# Patient Record
Sex: Female | Born: 1950 | Race: White | Hispanic: No | Marital: Single | Smoking: Never smoker
Health system: Southern US, Community
[De-identification: ages and names within clinical notes are randomized; demographics above are authoritative.]

## PROBLEM LIST (undated history)

## (undated) DIAGNOSIS — R0602 Shortness of breath: Secondary | ICD-10-CM

## (undated) DIAGNOSIS — F419 Anxiety disorder, unspecified: Secondary | ICD-10-CM

## (undated) DIAGNOSIS — K219 Gastro-esophageal reflux disease without esophagitis: Secondary | ICD-10-CM

## (undated) DIAGNOSIS — G4730 Sleep apnea, unspecified: Secondary | ICD-10-CM

## (undated) DIAGNOSIS — L719 Rosacea, unspecified: Secondary | ICD-10-CM

## (undated) DIAGNOSIS — D649 Anemia, unspecified: Secondary | ICD-10-CM

## (undated) DIAGNOSIS — E669 Obesity, unspecified: Secondary | ICD-10-CM

## (undated) DIAGNOSIS — R002 Palpitations: Secondary | ICD-10-CM

## (undated) DIAGNOSIS — M549 Dorsalgia, unspecified: Secondary | ICD-10-CM

## (undated) DIAGNOSIS — K353 Acute appendicitis with localized peritonitis, without perforation or gangrene: Secondary | ICD-10-CM

## (undated) DIAGNOSIS — M2550 Pain in unspecified joint: Secondary | ICD-10-CM

## (undated) DIAGNOSIS — I10 Essential (primary) hypertension: Secondary | ICD-10-CM

## (undated) DIAGNOSIS — T7840XA Allergy, unspecified, initial encounter: Secondary | ICD-10-CM

## (undated) DIAGNOSIS — Z86010 Personal history of colonic polyps: Principal | ICD-10-CM

## (undated) DIAGNOSIS — M7989 Other specified soft tissue disorders: Secondary | ICD-10-CM

## (undated) DIAGNOSIS — F32A Depression, unspecified: Secondary | ICD-10-CM

## (undated) HISTORY — PX: COLONOSCOPY: SHX174

## (undated) HISTORY — DX: Acute appendicitis with localized peritonitis, without perforation or gangrene: K35.30

## (undated) HISTORY — DX: Pain in unspecified joint: M25.50

## (undated) HISTORY — PX: OTHER SURGICAL HISTORY: SHX169

## (undated) HISTORY — DX: Other specified soft tissue disorders: M79.89

## (undated) HISTORY — DX: Palpitations: R00.2

## (undated) HISTORY — DX: Rosacea, unspecified: L71.9

## (undated) HISTORY — DX: Depression, unspecified: F32.A

## (undated) HISTORY — DX: Sleep apnea, unspecified: G47.30

## (undated) HISTORY — DX: Essential (primary) hypertension: I10

## (undated) HISTORY — PX: WISDOM TOOTH EXTRACTION: SHX21

## (undated) HISTORY — DX: Gastro-esophageal reflux disease without esophagitis: K21.9

## (undated) HISTORY — DX: Dorsalgia, unspecified: M54.9

## (undated) HISTORY — DX: Anemia, unspecified: D64.9

## (undated) HISTORY — DX: Personal history of colonic polyps: Z86.010

## (undated) HISTORY — DX: Anxiety disorder, unspecified: F41.9

## (undated) HISTORY — DX: Shortness of breath: R06.02

## (undated) HISTORY — DX: Allergy, unspecified, initial encounter: T78.40XA

## (undated) HISTORY — PX: ABDOMINAL HYSTERECTOMY: SHX81

## (undated) HISTORY — PX: KNEE SURGERY: SHX244

## (undated) HISTORY — DX: Obesity, unspecified: E66.9

---

## 1997-08-28 ENCOUNTER — Other Ambulatory Visit: Admission: RE | Admit: 1997-08-28 | Discharge: 1997-08-28 | Payer: Self-pay | Admitting: *Deleted

## 1997-08-31 ENCOUNTER — Ambulatory Visit (HOSPITAL_COMMUNITY): Admission: RE | Admit: 1997-08-31 | Discharge: 1997-08-31 | Payer: Self-pay | Admitting: *Deleted

## 2003-01-26 ENCOUNTER — Other Ambulatory Visit: Admission: RE | Admit: 2003-01-26 | Discharge: 2003-01-26 | Payer: Self-pay | Admitting: *Deleted

## 2003-04-29 ENCOUNTER — Ambulatory Visit (HOSPITAL_COMMUNITY): Admission: RE | Admit: 2003-04-29 | Discharge: 2003-04-29 | Payer: Self-pay | Admitting: *Deleted

## 2003-05-06 ENCOUNTER — Encounter: Admission: RE | Admit: 2003-05-06 | Discharge: 2003-05-06 | Payer: Self-pay | Admitting: *Deleted

## 2013-11-06 ENCOUNTER — Other Ambulatory Visit: Payer: Self-pay

## 2014-05-18 ENCOUNTER — Other Ambulatory Visit: Payer: Self-pay

## 2014-05-18 DIAGNOSIS — Z1231 Encounter for screening mammogram for malignant neoplasm of breast: Secondary | ICD-10-CM

## 2014-05-18 DIAGNOSIS — I10 Essential (primary) hypertension: Secondary | ICD-10-CM | POA: Insufficient documentation

## 2014-05-18 DIAGNOSIS — Z0000 Encounter for general adult medical examination without abnormal findings: Secondary | ICD-10-CM | POA: Insufficient documentation

## 2014-05-18 DIAGNOSIS — G4733 Obstructive sleep apnea (adult) (pediatric): Secondary | ICD-10-CM | POA: Insufficient documentation

## 2014-05-18 DIAGNOSIS — F419 Anxiety disorder, unspecified: Secondary | ICD-10-CM | POA: Insufficient documentation

## 2014-06-01 HISTORY — PX: APPENDECTOMY: SHX54

## 2014-06-28 ENCOUNTER — Emergency Department (HOSPITAL_COMMUNITY): Payer: BC Managed Care – PPO | Admitting: Anesthesiology

## 2014-06-28 ENCOUNTER — Encounter (HOSPITAL_COMMUNITY)
Admission: EM | Disposition: A | Discharge status: Home / Self Care | Payer: Self-pay | Source: Home / Self Care | Attending: Emergency Medicine

## 2014-06-28 ENCOUNTER — Ambulatory Visit (INDEPENDENT_AMBULATORY_CARE_PROVIDER_SITE_OTHER): Payer: BC Managed Care – PPO | Admitting: Family Medicine

## 2014-06-28 ENCOUNTER — Encounter (HOSPITAL_COMMUNITY): Payer: Self-pay | Admitting: Emergency Medicine

## 2014-06-28 ENCOUNTER — Emergency Department (HOSPITAL_COMMUNITY): Payer: BC Managed Care – PPO

## 2014-06-28 ENCOUNTER — Observation Stay (HOSPITAL_COMMUNITY)
Admission: EM | Admit: 2014-06-28 | Discharge: 2014-06-29 | Disposition: A | Discharge status: Home / Self Care | Payer: BC Managed Care – PPO | Attending: General Surgery | Admitting: General Surgery

## 2014-06-28 VITALS — BP 138/62 | HR 97 | Temp 99.2°F | Resp 20 | Ht 68.0 in | Wt 211.0 lb

## 2014-06-28 DIAGNOSIS — R1031 Right lower quadrant pain: Secondary | ICD-10-CM | POA: Diagnosis not present

## 2014-06-28 DIAGNOSIS — E669 Obesity, unspecified: Secondary | ICD-10-CM | POA: Diagnosis not present

## 2014-06-28 DIAGNOSIS — K353 Acute appendicitis with localized peritonitis, without perforation or gangrene: Secondary | ICD-10-CM

## 2014-06-28 DIAGNOSIS — R319 Hematuria, unspecified: Secondary | ICD-10-CM | POA: Diagnosis not present

## 2014-06-28 DIAGNOSIS — Z6830 Body mass index (BMI) 30.0-30.9, adult: Secondary | ICD-10-CM | POA: Diagnosis not present

## 2014-06-28 DIAGNOSIS — G4730 Sleep apnea, unspecified: Secondary | ICD-10-CM | POA: Diagnosis not present

## 2014-06-28 DIAGNOSIS — I10 Essential (primary) hypertension: Secondary | ICD-10-CM | POA: Insufficient documentation

## 2014-06-28 DIAGNOSIS — D72829 Elevated white blood cell count, unspecified: Secondary | ICD-10-CM | POA: Diagnosis not present

## 2014-06-28 DIAGNOSIS — K429 Umbilical hernia without obstruction or gangrene: Secondary | ICD-10-CM | POA: Insufficient documentation

## 2014-06-28 HISTORY — PX: LAPAROSCOPIC APPENDECTOMY: SHX408

## 2014-06-28 HISTORY — PX: UMBILICAL HERNIA REPAIR: SHX196

## 2014-06-28 HISTORY — DX: Acute appendicitis with localized peritonitis, without perforation or gangrene: K35.30

## 2014-06-28 LAB — COMPLETE METABOLIC PANEL WITH GFR
AST: 17 U/L (ref 0–37)
Albumin: 4 g/dL (ref 3.5–5.2)
BUN: 10 mg/dL (ref 6–23)
CO2: 29 mEq/L (ref 19–32)
Chloride: 100 mEq/L (ref 96–112)
Creat: 0.66 mg/dL (ref 0.50–1.10)
GFR, Est Non African American: >89 mL/min
Glucose, Bld: 83 mg/dL (ref 70–99)
Potassium: 3.7 mEq/L (ref 3.5–5.3)
Total Bilirubin: 0.6 mg/dL (ref 0.2–1.2)
Total Protein: 6.8 g/dL (ref 6.0–8.3)

## 2014-06-28 LAB — POCT URINALYSIS DIPSTICK
Bilirubin, UA: NEGATIVE
Glucose, UA: NEGATIVE
Ketones, UA: NEGATIVE
Leukocytes, UA: NEGATIVE
Nitrite, UA: NEGATIVE
Protein, UA: NEGATIVE
Spec Grav, UA: 1.015
Urobilinogen, UA: 0.2
pH, UA: 7

## 2014-06-28 LAB — COMPREHENSIVE METABOLIC PANEL
ALK PHOS: 65 U/L (ref 39–117)
ALT: 37 U/L — ABNORMAL HIGH (ref 0–35)
ANION GAP: 10 (ref 5–15)
AST: 23 U/L (ref 0–37)
Albumin: 4.1 g/dL (ref 3.5–5.2)
BUN: 10 mg/dL (ref 6–23)
CALCIUM: 8.9 mg/dL (ref 8.4–10.5)
CO2: 28 mmol/L (ref 19–32)
Chloride: 101 mmol/L (ref 96–112)
Creatinine, Ser: 0.6 mg/dL (ref 0.50–1.10)
GFR calc Af Amer: >90 mL/min (ref >90)
GFR calc non Af Amer: >90 mL/min (ref >90)
Glucose, Bld: 101 mg/dL — ABNORMAL HIGH (ref 70–99)
POTASSIUM: 3 mmol/L — AB (ref 3.5–5.1)
SODIUM: 139 mmol/L (ref 135–145)
TOTAL PROTEIN: 7.1 g/dL (ref 6.0–8.3)
Total Bilirubin: 0.6 mg/dL (ref 0.3–1.2)

## 2014-06-28 LAB — CBC WITH DIFFERENTIAL/PLATELET
Basophils Absolute: 0 10*3/uL (ref 0.0–0.1)
Basophils Relative: 0 % (ref 0–1)
EOS PCT: 1 % (ref 0–5)
Eosinophils Absolute: 0.1 10*3/uL (ref 0.0–0.7)
HCT: 36.6 % (ref 36.0–46.0)
HEMOGLOBIN: 12.1 g/dL (ref 12.0–15.0)
Lymphocytes Relative: 10 % — ABNORMAL LOW (ref 12–46)
Lymphs Abs: 1.5 10*3/uL (ref 0.7–4.0)
MCH: 29.9 pg (ref 26.0–34.0)
MCHC: 33.1 g/dL (ref 30.0–36.0)
MCV: 90.4 fL (ref 78.0–100.0)
Monocytes Absolute: 0.6 10*3/uL (ref 0.1–1.0)
Monocytes Relative: 5 % (ref 3–12)
NEUTROS ABS: 11.9 10*3/uL — AB (ref 1.7–7.7)
Neutrophils Relative %: 84 % — ABNORMAL HIGH (ref 43–77)
PLATELETS: 266 10*3/uL (ref 150–400)
RBC: 4.05 MIL/uL (ref 3.87–5.11)
RDW: 13.1 % (ref 11.5–15.5)
WBC: 14.1 10*3/uL — ABNORMAL HIGH (ref 4.0–10.5)

## 2014-06-28 LAB — URINALYSIS, ROUTINE W REFLEX MICROSCOPIC
Bilirubin Urine: NEGATIVE
GLUCOSE, UA: NEGATIVE mg/dL
Hgb urine dipstick: NEGATIVE
Ketones, ur: NEGATIVE mg/dL
Leukocytes, UA: NEGATIVE
Nitrite: NEGATIVE
Protein, ur: NEGATIVE mg/dL
Specific Gravity, Urine: 1.003 — ABNORMAL LOW (ref 1.005–1.030)
Urobilinogen, UA: 0.2 mg/dL (ref 0.0–1.0)
pH: 6 (ref 5.0–8.0)

## 2014-06-28 LAB — POCT CBC
Granulocyte percent: 84 % — AB (ref 37–80)
HCT, POC: 36.3 % — AB (ref 37.7–47.9)
Hemoglobin: 12.1 g/dL — AB (ref 12.2–16.2)
Lymph, poc: 1.8 (ref 0.6–3.4)
MCH, POC: 30 pg (ref 27–31.2)
MCHC: 33.3 g/dL (ref 31.8–35.4)
MCV: 90.3 fL (ref 80–97)
MID (cbc): 0.8 (ref 0–0.9)
MPV: 6.7 fL (ref 0–99.8)
POC Granulocyte: 13.3 — AB (ref 2–6.9)
POC LYMPH PERCENT: 11.1 %L (ref 10–50)
POC MID %: 4.9 % (ref 0–12)
Platelet Count, POC: 308 10*3/uL (ref 142–424)
RBC: 4.03 M/uL — AB (ref 4.04–5.48)
RDW, POC: 13.6 %
WBC: 15.8 10*3/uL — AB (ref 4.6–10.2)

## 2014-06-28 LAB — COMPLETE METABOLIC PANEL WITHOUT GFR
ALT: 32 U/L (ref 0–35)
Alkaline Phosphatase: 63 U/L (ref 39–117)
Calcium: 9 mg/dL (ref 8.4–10.5)
GFR, Est African American: >89 mL/min
Sodium: 139 meq/L (ref 135–145)

## 2014-06-28 LAB — POCT UA - MICROSCOPIC ONLY
Bacteria, U Microscopic: NEGATIVE
Casts, Ur, LPF, POC: NEGATIVE
Crystals, Ur, HPF, POC: NEGATIVE
Mucus, UA: NEGATIVE
RBC, urine, microscopic: NEGATIVE
Yeast, UA: NEGATIVE

## 2014-06-28 LAB — LIPASE, BLOOD: Lipase: 15 U/L (ref 11–59)

## 2014-06-28 SURGERY — APPENDECTOMY, LAPAROSCOPIC
Anesthesia: General

## 2014-06-28 MED ORDER — HYDROCODONE-ACETAMINOPHEN 5-325 MG PO TABS: 1.0000 | ORAL_TABLET | Freq: Four times a day (QID) | ORAL | Status: DC | PRN

## 2014-06-28 MED ORDER — SODIUM CHLORIDE 0.9 % IV BOLUS (SEPSIS)
500.0000 mL | Freq: Once | INTRAVENOUS | Status: AC
  Administered 2014-06-28: 500 mL via INTRAVENOUS

## 2014-06-28 MED ORDER — HYDROMORPHONE HCL 1 MG/ML IJ SOLN: 0.2500 mg | INTRAMUSCULAR | Status: DC | PRN

## 2014-06-28 MED ORDER — ONDANSETRON HCL 4 MG/2ML IJ SOLN: 4.0000 mg | Freq: Four times a day (QID) | INTRAMUSCULAR | Status: DC | PRN

## 2014-06-28 MED ORDER — LOSARTAN POTASSIUM-HCTZ 100-25 MG PO TABS: 1.0000 | ORAL_TABLET | Freq: Every day | ORAL | Status: DC

## 2014-06-28 MED ORDER — ONDANSETRON HCL 4 MG PO TABS: 4.0000 mg | ORAL_TABLET | Freq: Four times a day (QID) | ORAL | Status: DC | PRN

## 2014-06-28 MED ORDER — BUPIVACAINE-EPINEPHRINE 0.5% -1:200000 IJ SOLN
INTRAMUSCULAR | Status: AC
Start: 2014-06-28 — End: 2014-06-28
  Filled 2014-06-28: qty 1

## 2014-06-28 MED ORDER — IOHEXOL 300 MG/ML  SOLN
100.0000 mL | Freq: Once | INTRAMUSCULAR | Status: AC | PRN
  Administered 2014-06-28: 100 mL via INTRAVENOUS

## 2014-06-28 MED ORDER — PIPERACILLIN-TAZOBACTAM 3.375 G IVPB 30 MIN
3.3750 g | Freq: Once | INTRAVENOUS | Status: AC
  Administered 2014-06-28: 3.375 g via INTRAVENOUS
  Filled 2014-06-28: qty 50

## 2014-06-28 MED ORDER — METOCLOPRAMIDE HCL 5 MG/ML IJ SOLN
INTRAMUSCULAR | Status: DC | PRN
  Administered 2014-06-28: 10 mg via INTRAVENOUS

## 2014-06-28 MED ORDER — PROMETHAZINE HCL 25 MG/ML IJ SOLN: 6.2500 mg | INTRAMUSCULAR | Status: DC | PRN

## 2014-06-28 MED ORDER — GLYCOPYRROLATE 0.2 MG/ML IJ SOLN
INTRAMUSCULAR | Status: DC | PRN
  Administered 2014-06-28: .8 mg via INTRAVENOUS

## 2014-06-28 MED ORDER — PROPOFOL 10 MG/ML IV BOLUS
INTRAVENOUS | Status: DC | PRN
  Administered 2014-06-28: 170 mg via INTRAVENOUS

## 2014-06-28 MED ORDER — KCL IN DEXTROSE-NACL 20-5-0.9 MEQ/L-%-% IV SOLN
INTRAVENOUS | Status: DC
  Administered 2014-06-29: 04:00:00 via INTRAVENOUS
  Filled 2014-06-28 (×3): qty 1000

## 2014-06-28 MED ORDER — LACTATED RINGERS IV SOLN
INTRAVENOUS | Status: DC | PRN
  Administered 2014-06-28: 21:00:00 via INTRAVENOUS

## 2014-06-28 MED ORDER — DEXAMETHASONE SODIUM PHOSPHATE 10 MG/ML IJ SOLN
INTRAMUSCULAR | Status: DC | PRN
  Administered 2014-06-28: 10 mg via INTRAVENOUS

## 2014-06-28 MED ORDER — 0.9 % SODIUM CHLORIDE (POUR BTL) OPTIME
TOPICAL | Status: DC | PRN
  Administered 2014-06-28: 1000 mL

## 2014-06-28 MED ORDER — METOCLOPRAMIDE HCL 5 MG/ML IJ SOLN
INTRAMUSCULAR | Status: AC
  Filled 2014-06-28: qty 2

## 2014-06-28 MED ORDER — LACTATED RINGERS IV SOLN
INTRAVENOUS | Status: DC
  Administered 2014-06-28: 23:00:00 via INTRAVENOUS

## 2014-06-28 MED ORDER — HEPARIN SODIUM (PORCINE) 5000 UNIT/ML IJ SOLN
5000.0000 [IU] | Freq: Three times a day (TID) | INTRAMUSCULAR | Status: DC
  Administered 2014-06-29: 5000 [IU] via SUBCUTANEOUS
  Filled 2014-06-28 (×4): qty 1

## 2014-06-28 MED ORDER — CISATRACURIUM BESYLATE 20 MG/10ML IV SOLN
INTRAVENOUS | Status: AC
  Filled 2014-06-28: qty 10

## 2014-06-28 MED ORDER — CISATRACURIUM BESYLATE (PF) 10 MG/5ML IV SOLN
INTRAVENOUS | Status: DC | PRN
  Administered 2014-06-28: 8 mg via INTRAVENOUS

## 2014-06-28 MED ORDER — TAMSULOSIN HCL 0.4 MG PO CAPS
0.4000 mg | ORAL_CAPSULE | Freq: Every day | ORAL | Status: DC
  Administered 2014-06-29: 0.4 mg via ORAL
  Filled 2014-06-28: qty 1

## 2014-06-28 MED ORDER — MIDAZOLAM HCL 2 MG/2ML IJ SOLN
INTRAMUSCULAR | Status: AC
  Filled 2014-06-28: qty 2

## 2014-06-28 MED ORDER — MIDAZOLAM HCL 5 MG/5ML IJ SOLN
INTRAMUSCULAR | Status: DC | PRN
  Administered 2014-06-28: 2 mg via INTRAVENOUS

## 2014-06-28 MED ORDER — OXYCODONE HCL 5 MG PO TABS: 5.0000 mg | ORAL_TABLET | Freq: Once | ORAL | Status: DC | PRN

## 2014-06-28 MED ORDER — BUPIVACAINE-EPINEPHRINE 0.5% -1:200000 IJ SOLN
INTRAMUSCULAR | Status: DC | PRN
  Administered 2014-06-28: 50 mL

## 2014-06-28 MED ORDER — SUCCINYLCHOLINE CHLORIDE 20 MG/ML IJ SOLN
INTRAMUSCULAR | Status: DC | PRN
  Administered 2014-06-28: 100 mg via INTRAVENOUS

## 2014-06-28 MED ORDER — TAMSULOSIN HCL 0.4 MG PO CAPS: 0.4000 mg | ORAL_CAPSULE | Freq: Every day | ORAL | Status: DC

## 2014-06-28 MED ORDER — ONDANSETRON HCL 4 MG/2ML IJ SOLN
INTRAMUSCULAR | Status: DC | PRN
Start: 2014-06-28 — End: 2014-06-28
  Administered 2014-06-28: 4 mg via INTRAVENOUS

## 2014-06-28 MED ORDER — PROPOFOL 10 MG/ML IV BOLUS
INTRAVENOUS | Status: AC
  Filled 2014-06-28: qty 20

## 2014-06-28 MED ORDER — MENTHOL 3 MG MT LOZG
1.0000 | LOZENGE | OROMUCOSAL | Status: DC | PRN
  Administered 2014-06-28: 3 mg via ORAL
  Filled 2014-06-28 (×2): qty 9

## 2014-06-28 MED ORDER — POTASSIUM CHLORIDE 10 MEQ/100ML IV SOLN
10.0000 meq | INTRAVENOUS | Status: AC
  Administered 2014-06-28: 10 meq via INTRAVENOUS
  Filled 2014-06-28 (×2): qty 100

## 2014-06-28 MED ORDER — NEOSTIGMINE METHYLSULFATE 10 MG/10ML IV SOLN
INTRAVENOUS | Status: AC
  Filled 2014-06-28: qty 1

## 2014-06-28 MED ORDER — ONDANSETRON HCL 4 MG/2ML IJ SOLN
INTRAMUSCULAR | Status: AC
  Filled 2014-06-28: qty 2

## 2014-06-28 MED ORDER — ONDANSETRON HCL 4 MG/2ML IJ SOLN
4.0000 mg | Freq: Once | INTRAMUSCULAR | Status: AC
  Administered 2014-06-28: 4 mg via INTRAVENOUS
  Filled 2014-06-28: qty 2

## 2014-06-28 MED ORDER — GLYCOPYRROLATE 0.2 MG/ML IJ SOLN
INTRAMUSCULAR | Status: AC
  Filled 2014-06-28: qty 3

## 2014-06-28 MED ORDER — VENLAFAXINE HCL ER 37.5 MG PO CP24
37.5000 mg | ORAL_CAPSULE | Freq: Every day | ORAL | Status: DC
  Administered 2014-06-29: 37.5 mg via ORAL
  Filled 2014-06-28 (×2): qty 1

## 2014-06-28 MED ORDER — FENTANYL CITRATE 0.05 MG/ML IJ SOLN
INTRAMUSCULAR | Status: DC | PRN
  Administered 2014-06-28: 100 ug via INTRAVENOUS

## 2014-06-28 MED ORDER — GLYCOPYRROLATE 0.2 MG/ML IJ SOLN
INTRAMUSCULAR | Status: AC
  Filled 2014-06-28: qty 1

## 2014-06-28 MED ORDER — MORPHINE SULFATE 4 MG/ML IJ SOLN
4.0000 mg | Freq: Once | INTRAMUSCULAR | Status: AC
  Administered 2014-06-28: 4 mg via INTRAVENOUS
  Filled 2014-06-28: qty 1

## 2014-06-28 MED ORDER — LOSARTAN POTASSIUM 50 MG PO TABS
100.0000 mg | ORAL_TABLET | Freq: Every day | ORAL | Status: DC
  Administered 2014-06-29: 100 mg via ORAL
  Filled 2014-06-28: qty 2

## 2014-06-28 MED ORDER — LACTATED RINGERS IR SOLN
Status: DC | PRN
  Administered 2014-06-28: 1000 mL

## 2014-06-28 MED ORDER — IOHEXOL 300 MG/ML  SOLN
25.0000 mL | Freq: Once | INTRAMUSCULAR | Status: AC | PRN
  Administered 2014-06-28: 25 mL via ORAL

## 2014-06-28 MED ORDER — HYDROCHLOROTHIAZIDE 25 MG PO TABS
25.0000 mg | ORAL_TABLET | Freq: Every day | ORAL | Status: DC
  Administered 2014-06-29: 25 mg via ORAL
  Filled 2014-06-28: qty 1

## 2014-06-28 MED ORDER — DEXAMETHASONE SODIUM PHOSPHATE 10 MG/ML IJ SOLN
INTRAMUSCULAR | Status: AC
  Filled 2014-06-28: qty 1

## 2014-06-28 MED ORDER — OXYCODONE HCL 5 MG/5ML PO SOLN
5.0000 mg | Freq: Once | ORAL | Status: DC | PRN
  Filled 2014-06-28: qty 5

## 2014-06-28 MED ORDER — NEOSTIGMINE METHYLSULFATE 10 MG/10ML IV SOLN
INTRAVENOUS | Status: DC | PRN
  Administered 2014-06-28: 5 mg via INTRAVENOUS

## 2014-06-28 MED ORDER — OXYCODONE-ACETAMINOPHEN 5-325 MG PO TABS
1.0000 | ORAL_TABLET | ORAL | Status: DC | PRN
  Administered 2014-06-29 (×2): 1 via ORAL
  Filled 2014-06-28 (×2): qty 1

## 2014-06-28 MED ORDER — MORPHINE SULFATE 2 MG/ML IJ SOLN
2.0000 mg | INTRAMUSCULAR | Status: DC | PRN
  Administered 2014-06-29: 2 mg via INTRAVENOUS
  Filled 2014-06-28: qty 1

## 2014-06-28 MED ORDER — FENTANYL CITRATE 0.05 MG/ML IJ SOLN
INTRAMUSCULAR | Status: AC
  Filled 2014-06-28: qty 5

## 2014-06-28 SURGICAL SUPPLY — 30 items
APPLIER CLIP 5 13 M/L LIGAMAX5 (MISCELLANEOUS)
APPLIER CLIP ROT 10 11.4 M/L (STAPLE)
CHLORAPREP W/TINT 26ML (MISCELLANEOUS) ×3 IMPLANT
CLIP APPLIE 5 13 M/L LIGAMAX5 (MISCELLANEOUS) IMPLANT
CLIP APPLIE ROT 10 11.4 M/L (STAPLE) IMPLANT
CUTTER FLEX LINEAR 45M (STAPLE) ×3 IMPLANT
DECANTER SPIKE VIAL GLASS SM (MISCELLANEOUS) ×3 IMPLANT
DERMABOND ADVANCED (GAUZE/BANDAGES/DRESSINGS) ×2
DERMABOND ADVANCED .7 DNX12 (GAUZE/BANDAGES/DRESSINGS) ×1 IMPLANT
DRAPE LAPAROSCOPIC ABDOMINAL (DRAPES) ×3 IMPLANT
ELECT REM PT RETURN 9FT ADLT (ELECTROSURGICAL) ×3
ELECTRODE REM PT RTRN 9FT ADLT (ELECTROSURGICAL) ×1 IMPLANT
GLOVE ECLIPSE 7.5 STRL STRAW (GLOVE) ×3 IMPLANT
GOWN STRL REUS W/TWL XL LVL3 (GOWN DISPOSABLE) ×6 IMPLANT
KIT BASIN OR (CUSTOM PROCEDURE TRAY) ×3 IMPLANT
LIQUID BAND (GAUZE/BANDAGES/DRESSINGS) IMPLANT
PENCIL BUTTON HOLSTER BLD 10FT (ELECTRODE) ×3 IMPLANT
POUCH SPECIMEN RETRIEVAL 10MM (ENDOMECHANICALS) ×3 IMPLANT
RELOAD 45 VASCULAR/THIN (ENDOMECHANICALS) IMPLANT
RELOAD STAPLE TA45 3.5 REG BLU (ENDOMECHANICALS) ×3 IMPLANT
SET IRRIG TUBING LAPAROSCOPIC (IRRIGATION / IRRIGATOR) ×3 IMPLANT
SHEARS HARMONIC ACE PLUS 36CM (ENDOMECHANICALS) ×3 IMPLANT
SUT MNCRL AB 4-0 PS2 18 (SUTURE) ×3 IMPLANT
SUT VICRYL 0 UR6 27IN ABS (SUTURE) ×3 IMPLANT
TOWEL OR 17X26 10 PK STRL BLUE (TOWEL DISPOSABLE) ×3 IMPLANT
TRAY FOLEY CATH 14FRSI W/METER (CATHETERS) ×3 IMPLANT
TRAY LAPAROSCOPIC (CUSTOM PROCEDURE TRAY) ×3 IMPLANT
TROCAR BLADELESS OPT 5 100 (ENDOMECHANICALS) ×3 IMPLANT
TROCAR XCEL 12X100 BLDLESS (ENDOMECHANICALS) ×3 IMPLANT
TROCAR XCEL BLUNT TIP 100MML (ENDOMECHANICALS) ×3 IMPLANT

## 2014-06-28 NOTE — Transfer of Care (Signed)
Immediate Anesthesia Transfer of Care Note  Patient: Robin Mills  Procedure(s) Performed: Procedure(s): APPENDECTOMY LAPAROSCOPIC (N/A) HERNIA REPAIR UMBILICAL ADULT (N/A)  Patient Location: PACU  Anesthesia Type:General  Level of Consciousness: awake, sedated and patient cooperative  Airway & Oxygen Therapy: Patient Spontanous Breathing and Patient connected to face mask oxygen  Post-op Assessment: Report given to RN and Post -op Vital signs reviewed and stable  Post vital signs: Reviewed and stable  Last Vitals:  Filed Vitals:   06/28/14 1920  BP: 102/43  Pulse: 79  Temp: 36.7 C  Resp: 18    Complications: No apparent anesthesia complications

## 2014-06-28 NOTE — Anesthesia Postprocedure Evaluation (Signed)
Anesthesia Post Note  Patient: Robin Mills  Procedure(s) Performed: Procedure(s) (LRB): APPENDECTOMY LAPAROSCOPIC (N/A) HERNIA REPAIR UMBILICAL ADULT (N/A)  Anesthesia type: general  Patient location: PACU  Post pain: Pain level controlled  Post assessment: Patient's Cardiovascular Status Stable  Last Vitals:  Filed Vitals:   06/28/14 2204  BP: 152/58  Pulse: 86  Temp: 37 C  Resp:     Post vital signs: Reviewed and stable  Level of consciousness: sedated  Complications: No apparent anesthesia complications

## 2014-06-28 NOTE — Op Note (Signed)
Preoperative Diagnosis: RLQ abdominal pain [R10.31] Acute appendicitis with localized peritonitis [K35.3]  Postoprative Diagnosis: RLQ abdominal pain [R10.31] Acute appendicitis with localized peritonitis [K35.3]  Procedure: Procedure(s): APPENDECTOMY LAPAROSCOPIC HERNIA REPAIR UMBILICAL ADULT   Surgeon: Excell Seltzer T   Assistants: None  Anesthesia:  General endotracheal anesthesia  Indications: Patient is a 64 year old female that presents with 18 hours of worsening persistent right lower quadrant abdominal pain. She has elevated white count and localized peritonitis on exam and CT scan has confirmed apparent uncomplicated significant acute appendicitis. I recommended proceeding with laparoscopic appendectomy. We've discussed the surgery and indications and risks detailed elsewhere and she agrees to proceed    Procedure Detail:  Patient received broad-spectrum preoperative IV antibiotics in the emergency department. She was brought to the operating room, placed in the supine position on the operating table, and general endotracheal anesthesia induced. Foley catheter was placed. PAS were placed. The abdomen was widely sterilely prepped and draped. Patient timeout was performed and correct procedure verified. A curvilinear incision was made just beneath the umbilicus. The patient had a known small umbilical hernia. The hernia sac and contents were dissected away from the umbilical skin and the hernia sac opened and the sac and preperitoneal fat excised with a Harmonic scalpel. This left approximately 1 cm fascial defect. 2 interrupted 0 Prolene sutures were placed on either end of the defect in the esophageal trocar inserted and pneumoperitoneum established. Under direct vision a 5 mm trocar was placed in the right upper quadrant and a 12 mm trocar in the left lower quadrant. The appendix was visualized just medial to the cecum and elevated. It was acutely inflamed with exudate but no  evidence of gangrene or perforation. A few inflammatory adhesions of the terminal ileum were easily taken down with blunt dissection and the base of the appendix and mesoappendix well exposed. The mesoappendix was then sequentially divided with the Harmonic scalpel until the appendix was completely freed out of the tip of the cecum. The appendix was then divided across the tip of the cecum with a single firing of the GIA 45 mm blue load stapler. The staple line was intact and without bleeding. The appendix was placed in Endo Catch bag and brought out through the umbilical incision. The operative site and general abdomen were irrigated and suctioned until clear. There was no evidence of bleeding or trocar injury or other problems. The [facility] trocar was removed and I placed an additional figure-of-eight suture of 0 Vicryl at the fascial defect. The umbilical sutures were tied and the closure was visualized from the left lower quadrant camera site internally and appeared fine. All CO2 was evacuated and trochars removed. Skin incisions were closed with subcutaneous, Monocryl and Dermabond. Sponge needle and instrument counts were correct.    Findings: Acute appendicitis with exudate, no gangrene or perforation  Estimated Blood Loss:  Minimal         Drains: nnone  Blood Given: none          Specimens: Appendix        Complications:  * No complications entered in OR log *         Disposition: PACU - hemodynamically stable.         Condition: stable

## 2014-06-28 NOTE — Progress Notes (Signed)
Error incorrect wound class. Correct wound class is contaminated.

## 2014-06-28 NOTE — Anesthesia Procedure Notes (Signed)
Procedure Name: Intubation Date/Time: 06/28/2014 8:49 PM Performed by: Johnathan Hausen A Pre-anesthesia Checklist: Patient identified, Timeout performed, Emergency Drugs available, Suction available and Patient being monitored Patient Re-evaluated:Patient Re-evaluated prior to inductionOxygen Delivery Method: Circle system utilized Preoxygenation: Pre-oxygenation with 100% oxygen Intubation Type: IV induction and Cricoid Pressure applied Ventilation: Mask ventilation without difficulty Laryngoscope Size: Mac and 4 Grade View: Grade I Tube type: Oral Tube size: 7.5 mm Number of attempts: 1 Airway Equipment and Method: Stylet Placement Confirmation: ETT inserted through vocal cords under direct vision,  positive ETCO2 and breath sounds checked- equal and bilateral Secured at: 23 cm Tube secured with: Tape Dental Injury: Teeth and Oropharynx as per pre-operative assessment

## 2014-06-28 NOTE — ED Notes (Signed)
Zofran offered however pt declines offer. Protocol labs not ordered due to collection at [facility].

## 2014-06-28 NOTE — H&P (Signed)
Robin Mills is an 64 y.o. female.    Chief Complaint: Abdominal pain  HPI: Patient presents with the gradual onset 18 hours ago of right lower quadrant abdominal pain. This worsened last night and through the day today. The pain became much worse with any movement and she would feel lightheaded and dizzy when she tried to get up and move around. She developed nausea without vomiting this morning. Also felt chilled and low-grade fever. She presented to urgent care and then was sent here to the emergency department for further evaluation. No vomiting. No change in bowel habits. No urinary symptoms. No previous history of similar symptoms or chronic GI complaints.  Past Medical History  Diagnosis Date  . Hypertension   . Sleep apnea     Past Surgical History  Procedure Laterality Date  . Abdominal hysterectomy    . Knee surgery    . Cardaic cath      2010-normal    Current Facility-Administered Medications  Medication Dose Route Frequency Provider Last Rate Last Dose  . potassium chloride 10 mEq in 100 mL IVPB  10 mEq Intravenous Q1 Hr x 3 Nat Christen, MD 100 mL/hr at 06/28/14 1826 10 mEq at 06/28/14 1826   Current Outpatient Prescriptions  Medication Sig Dispense Refill  . losartan-hydrochlorothiazide (HYZAAR) 100-25 MG per tablet Take 1 tablet by mouth daily.   7  . tetracycline (ACHROMYCIN,SUMYCIN) 500 MG capsule Take 500 mg by mouth daily.   3  . venlafaxine XR (EFFEXOR-XR) 37.5 MG 24 hr capsule Take 37.5 mg by mouth daily with breakfast.   3  . HYDROcodone-acetaminophen (NORCO) 5-325 MG per tablet Take 1 tablet by mouth every 6 (six) hours as needed for moderate pain. Be careful with constipation 30 tablet 0  . tamsulosin (FLOMAX) 0.4 MG CAPS capsule Take 1 capsule (0.4 mg total) by mouth daily. 30 capsule 0    No family history on file. Social History:  reports that she has never smoked. She does not have any smokeless tobacco history on file. She reports that she does not  drink alcohol. Her drug history is not on file.  Allergies:  Allergies  Allergen Reactions  . Sulfa Antibiotics Hives      Results for orders placed or performed during the hospital encounter of 06/28/14 (from the past 48 hour(s))  CBC with Differential     Status: Abnormal   Collection Time: 06/28/14  3:42 PM  Result Value Ref Range   WBC 14.1 (H) 4.0 - 10.5 K/uL   RBC 4.05 3.87 - 5.11 MIL/uL   Hemoglobin 12.1 12.0 - 15.0 g/dL   HCT 36.6 36.0 - 46.0 %   MCV 90.4 78.0 - 100.0 fL   MCH 29.9 26.0 - 34.0 pg   MCHC 33.1 30.0 - 36.0 g/dL   RDW 13.1 11.5 - 15.5 %   Platelets 266 150 - 400 K/uL   Neutrophils Relative % 84 (H) 43 - 77 %   Neutro Abs 11.9 (H) 1.7 - 7.7 K/uL   Lymphocytes Relative 10 (L) 12 - 46 %   Lymphs Abs 1.5 0.7 - 4.0 K/uL   Monocytes Relative 5 3 - 12 %   Monocytes Absolute 0.6 0.1 - 1.0 K/uL   Eosinophils Relative 1 0 - 5 %   Eosinophils Absolute 0.1 0.0 - 0.7 K/uL   Basophils Relative 0 0 - 1 %   Basophils Absolute 0.0 0.0 - 0.1 K/uL  Comprehensive metabolic panel     Status: Abnormal  Collection Time: 06/28/14  3:42 PM  Result Value Ref Range   Sodium 139 135 - 145 mmol/L   Potassium 3.0 (L) 3.5 - 5.1 mmol/L   Chloride 101 96 - 112 mmol/L   CO2 28 19 - 32 mmol/L   Glucose, Bld 101 (H) 70 - 99 mg/dL   BUN 10 6 - 23 mg/dL   Creatinine, Ser 0.60 0.50 - 1.10 mg/dL   Calcium 8.9 8.4 - 10.5 mg/dL   Total Protein 7.1 6.0 - 8.3 g/dL   Albumin 4.1 3.5 - 5.2 g/dL   AST 23 0 - 37 U/L   ALT 37 (H) 0 - 35 U/L   Alkaline Phosphatase 65 39 - 117 U/L   Total Bilirubin 0.6 0.3 - 1.2 mg/dL   GFR calc non Af Amer >90 >90 mL/min   GFR calc Af Amer >90 >90 mL/min    Comment: (NOTE) The eGFR has been calculated using the CKD EPI equation. This calculation has not been validated in all clinical situations. eGFR's persistently <90 mL/min signify possible Chronic Kidney Disease.    Anion gap 10 5 - 15    Comment: Performed at [facility]  Lipase, blood      Status: None   Collection Time: 06/28/14  3:42 PM  Result Value Ref Range   Lipase 15 11 - 59 U/L    Comment: Performed at [facility]  Urinalysis, Routine w reflex microscopic     Status: Abnormal   Collection Time: 06/28/14  4:01 PM  Result Value Ref Range   Color, Urine YELLOW YELLOW   APPearance CLEAR CLEAR   Specific Gravity, Urine 1.003 (L) 1.005 - 1.030   pH 6.0 5.0 - 8.0   Glucose, UA NEGATIVE NEGATIVE mg/dL   Hgb urine dipstick NEGATIVE NEGATIVE   Bilirubin Urine NEGATIVE NEGATIVE   Ketones, ur NEGATIVE NEGATIVE mg/dL   Protein, ur NEGATIVE NEGATIVE mg/dL   Urobilinogen, UA 0.2 0.0 - 1.0 mg/dL   Nitrite NEGATIVE NEGATIVE   Leukocytes, UA NEGATIVE NEGATIVE    Comment: MICROSCOPIC NOT DONE ON URINES WITH NEGATIVE PROTEIN, BLOOD, LEUKOCYTES, NITRITE, OR GLUCOSE <1000 mg/dL.   Ct Abdomen Pelvis W Contrast  06/28/2014   CLINICAL DATA:  Right lower quadrant abdominal pain.  Nausea.  EXAM: CT ABDOMEN AND PELVIS WITH CONTRAST  TECHNIQUE: Multidetector CT imaging of the abdomen and pelvis was performed using the standard protocol following bolus administration of intravenous contrast.  CONTRAST:  126m OMNIPAQUE IOHEXOL 300 MG/ML SOLN, 231mOMNIPAQUE IOHEXOL 300 MG/ML SOLN  COMPARISON:  None.  FINDINGS: Lower chest: Left lower lobe scratch that mild left lower lobe atelectasis. There is also dependent subsegmental atelectasis in both lower lobes and in the lingula. Mild cylindrical bronchiectasis is present in both lower lobes.  Hepatobiliary: 5 mm hypodense lesion in segment 4a of the liver, technically too small to characterize although statistically likely to be a small cyst or hemangioma. Similar 3 mm lesion in segment for PE.  Dependent density in the gallbladder may reflect sludge or gallstones.  Pancreas: Unremarkable  Spleen: Unremarkable  Adrenals/Urinary Tract: Unremarkable  Stomach/Bowel: There is acute appendicitis, appendiceal diameter 1.1 cm, with enhancing mucosa and  periappendiceal stranding. No findings of rupture or abscess.  Scattered colonic diverticula present. There is several duodenal diverticula which did not appear inflamed.  Vascular/Lymphatic: Unremarkable  Reproductive: Uterus absent.  Ovaries not well seen.  Other: No supplemental non-categorized findings.  Musculoskeletal: Small umbilical hernia contains adipose tissue. Lumbar spondylosis and degenerative disc  disease observed.  IMPRESSION: 1. Acute appendicitis with appendiceal wall thickening, abnormal enhancement, and surrounding inflammatory stranding. No extraluminal gas or abscess. 2. Mild bibasilar atelectasis. Cylindrical bronchiectasis in both lower lobes. 3. Mild sigmoid colon diverticulosis. 4. Small umbilical hernia contains adipose tissue. 5. Several tiny hypodense lesions in segment 4 of the liver, technically too small to characterize although statistically likely to be small cysts or hemangiomas.   Electronically Signed   By: Van Clines M.D.   On: 06/28/2014 18:26    Review of Systems  Constitutional: Positive for fever and chills.  Respiratory: Negative.   Cardiovascular: Negative.   Gastrointestinal: Positive for nausea and abdominal pain. Negative for vomiting, diarrhea, constipation and blood in stool.  Genitourinary: Negative.     Blood pressure 102/43, pulse 79, temperature 98 F (36.7 C), temperature source Oral, resp. rate 18, SpO2 92 %. Physical Exam  General: Alert, Moderately obese Caucasian female, in no distress Skin: Warm and dry without rash or infection. HEENT: No palpable masses or thyromegaly. Sclera nonicteric.  Lymph nodes: No cervical, supraclavicular, or inguinal nodes palpable. Lungs: Breath sounds clear and equal without increased work of breathing Cardiovascular: Regular rate and rhythm without murmur. No JVD or edema. Peripheral pulses intact. Abdomen: Nondistended. There is well localized tenderness in the right lower quadrant with guarding and  local peritoneal signs.. No masses palpable. No organomegaly. Small reducible nontender umbilical hernia. Extremities: No edema or joint swelling or deformity. No chronic venous stasis changes. Neurologic: Alert and fully oriented. No gross motor deficits.  Assessment/Plan Acute appendicitis. No apparent complicating factors. I recommended proceeding with laparoscopic appendectomy. I discussed the indications for the surgery and its nature and recovery as well as risks of anesthetic complications, bleeding, infection, visceral injury and possible need for open surgery. She understands and agrees to proceed.  Daphane Odekirk T 06/28/2014, 7:30 PM

## 2014-06-28 NOTE — ED Notes (Signed)
Caryl Pina [phone] Daughter would like to be called if anything changes.

## 2014-06-28 NOTE — ED Notes (Signed)
This nurse at bedside to have OR consent signed Patient stated that she has specific concerns that she wants to speak with Dr. Excell Seltzer about (No students, No orientation employees) Patient stated that she wants OR MD to specifically right down these concerns/request on the consent Santiago Glad, RN in [place] called and made aware that patient has not yet signed the consent due to the above noted concerns Per Santiago Glad, RN patient is to stay in ED until consent can be signed Dr. Excell Seltzer to come to ED ED Charge made aware of delay

## 2014-06-28 NOTE — Patient Instructions (Signed)

## 2014-06-28 NOTE — ED Notes (Addendum)
Pt from UC c/o RLQ pain since last pm. Hervey Ard in character. Nausea present denies vomiting.Reports fevers. UC recommends CT of abdomen. Denies urinary symptoms.

## 2014-06-28 NOTE — ED Provider Notes (Addendum)
CSN: [id number]     Arrival date & time 06/28/14  1441 History   First MD Initiated Contact with Patient 06/28/14 1536     Chief Complaint  Patient presents with  . Abdominal Pain  . Nausea     (Consider location/radiation/quality/duration/timing/severity/associated sxs/prior Treatment) HPI... right lower quadrant pain since early this morning with associated anorexia. Past medical history includes obesity, sleep apnea, hypertension. Status post vaginal hysterectomy years ago. She was seen at a local urgent care center today and transferred to the emergency department. No dysuria, fever, chills, vaginal bleeding, vaginal discharge. Normal urinary output. Normal bowel movements.  Past Medical History  Diagnosis Date  . Hypertension   . Sleep apnea    Past Surgical History  Procedure Laterality Date  . Abdominal hysterectomy    . Knee surgery    . Cardaic cath      2010-normal   No family history on file. History  Substance Use Topics  . Smoking status: Never Smoker   . Smokeless tobacco: Not on file  . Alcohol Use: No     Comment: social   OB History    No data available     Review of Systems  All other systems reviewed and are negative.     Allergies  Sulfa antibiotics  Home Medications   Prior to Admission medications   Medication Sig Start Date End Date Taking? Authorizing Provider  losartan-hydrochlorothiazide (HYZAAR) 100-25 MG per tablet Take 1 tablet by mouth daily.  05/18/14  Yes Historical Provider, MD  tetracycline (ACHROMYCIN,SUMYCIN) 500 MG capsule Take 500 mg by mouth daily.  05/18/14  Yes Historical Provider, MD  venlafaxine XR (EFFEXOR-XR) 37.5 MG 24 hr capsule Take 37.5 mg by mouth daily with breakfast.  05/18/14  Yes Historical Provider, MD  HYDROcodone-acetaminophen (NORCO) 5-325 MG per tablet Take 1 tablet by mouth every 6 (six) hours as needed for moderate pain. Be careful with constipation 06/28/14   Thao P Le, DO  tamsulosin (FLOMAX) 0.4 MG CAPS  capsule Take 1 capsule (0.4 mg total) by mouth daily. 06/28/14   Thao P Le, DO   BP 138/62 mmHg  Pulse 95  Temp(Src) 98.1 F (36.7 C)  Resp 18  SpO2 97% Physical Exam  Constitutional: She is oriented to person, place, and time. She appears well-developed and well-nourished.  HENT:  Head: Normocephalic and atraumatic.  Eyes: Conjunctivae and EOM are normal. Pupils are equal, round, and reactive to light.  Neck: Normal range of motion. Neck supple.  Cardiovascular: Normal rate and regular rhythm.   Pulmonary/Chest: Effort normal and breath sounds normal.  Abdominal: Soft.  Tender right lower quadrant  Musculoskeletal: Normal range of motion.  Neurological: She is alert and oriented to person, place, and time.  Skin: Skin is warm and dry.  Psychiatric: She has a normal mood and affect. Her behavior is normal.  Nursing note and vitals reviewed.   ED Course  Procedures (including critical care time) Labs Review Labs Reviewed  CBC WITH DIFFERENTIAL/PLATELET - Abnormal; Notable for the following:    WBC 14.1 (*)    Neutrophils Relative % 84 (*)    Neutro Abs 11.9 (*)    Lymphocytes Relative 10 (*)    All other components within normal limits  COMPREHENSIVE METABOLIC PANEL - Abnormal; Notable for the following:    Potassium 3.0 (*)    Glucose, Bld 101 (*)    ALT 37 (*)    All other components within normal limits  URINALYSIS, ROUTINE W REFLEX  MICROSCOPIC - Abnormal; Notable for the following:    Specific Gravity, Urine 1.003 (*)    All other components within normal limits  LIPASE, BLOOD    Imaging Review Ct Abdomen Pelvis W Contrast  06/28/2014   CLINICAL DATA:  Right lower quadrant abdominal pain.  Nausea.  EXAM: CT ABDOMEN AND PELVIS WITH CONTRAST  TECHNIQUE: Multidetector CT imaging of the abdomen and pelvis was performed using the standard protocol following bolus administration of intravenous contrast.  CONTRAST:  117mL OMNIPAQUE IOHEXOL 300 MG/ML SOLN, 36mL OMNIPAQUE  IOHEXOL 300 MG/ML SOLN  COMPARISON:  None.  FINDINGS: Lower chest: Left lower lobe scratch that mild left lower lobe atelectasis. There is also dependent subsegmental atelectasis in both lower lobes and in the lingula. Mild cylindrical bronchiectasis is present in both lower lobes.  Hepatobiliary: 5 mm hypodense lesion in segment 4a of the liver, technically too small to characterize although statistically likely to be a small cyst or hemangioma. Similar 3 mm lesion in segment for PE.  Dependent density in the gallbladder may reflect sludge or gallstones.  Pancreas: Unremarkable  Spleen: Unremarkable  Adrenals/Urinary Tract: Unremarkable  Stomach/Bowel: There is acute appendicitis, appendiceal diameter 1.1 cm, with enhancing mucosa and periappendiceal stranding. No findings of rupture or abscess.  Scattered colonic diverticula present. There is several duodenal diverticula which did not appear inflamed.  Vascular/Lymphatic: Unremarkable  Reproductive: Uterus absent.  Ovaries not well seen.  Other: No supplemental non-categorized findings.  Musculoskeletal: Small umbilical hernia contains adipose tissue. Lumbar spondylosis and degenerative disc disease observed.  IMPRESSION: 1. Acute appendicitis with appendiceal wall thickening, abnormal enhancement, and surrounding inflammatory stranding. No extraluminal gas or abscess. 2. Mild bibasilar atelectasis. Cylindrical bronchiectasis in both lower lobes. 3. Mild sigmoid colon diverticulosis. 4. Small umbilical hernia contains adipose tissue. 5. Several tiny hypodense lesions in segment 4 of the liver, technically too small to characterize although statistically likely to be small cysts or hemangiomas.   Electronically Signed   By: Van Clines M.D.   On: 06/28/2014 18:26     EKG Interpretation None      MDM   Final diagnoses:  RLQ abdominal pain  Acute appendicitis with localized peritonitis    History and physical consistent with acute appendicitis.  CT scan confirms same. Discussed with Dr. Excell Seltzer.  IV Zosyn, IV pain management. Admit.    Nat Christen, MD 06/28/14 1846  Nat Christen, MD 06/28/14 [phone]

## 2014-06-28 NOTE — Progress Notes (Signed)
Chief Complaint:  Chief Complaint  Patient presents with  . Flank Pain    x 1 day, sharp, right, getting worse    HPI: Robin Mills is a 64 y.o. female who is here for  Once day hisoty of RLQ abd pain, denies fevers but doe sfeel warm, has had chill.  Last nght she was feeling some discomfort but now worse. There has been no pain where she can't cross her legs.  She was belching and burping, BM last night was small and round, not unusual for her. No hx of constipationhowever she goes to bathroom when she needs it She has been able to pass gas x 1 and then she belcedShe has nto eaten today, has had only water.  She has a pap smear scheduled for march Dr Robin Mills, no abnormal pap.   Heavy bleeding, no fibroids, over due for pap,  No urinary sysmtpoms, deneis ovarian cyst, kidney stones.  When she gets a harp pain it is 9/10   Past Medical History  Diagnosis Date  . Hypertension   . Sleep apnea    Past Surgical History  Procedure Laterality Date  . Abdominal hysterectomy    . Knee surgery    . Cardaic cath      2010-normal   History   Social History  . Marital Status: Unknown    Spouse Name: N/A  . Number of Children: N/A  . Years of Education: N/A   Social History Main Topics  . Smoking status: Never Smoker   . Smokeless tobacco: Not on file  . Alcohol Use: Not on file  . Drug Use: Not on file  . Sexual Activity: Not on file   Other Topics Concern  . None   Social History Narrative  . None   No family history on file. Allergies  Allergen Reactions  . Sulfa Antibiotics    Prior to Admission medications   Medication Sig Start Date End Date Taking? Authorizing Provider  losartan (COZAAR) 25 MG tablet Take 25 mg by mouth daily.   Yes Historical Provider, MD  tetracycline (ACHROMYCIN,SUMYCIN) 250 MG capsule Take 250 mg by mouth 4 (four) times daily.   Yes Historical Provider, MD  venlafaxine (EFFEXOR) 25 MG tablet Take 35 mg by mouth 2 (two)  times daily.   Yes Historical Provider, MD     ROS: The patient denies fevers, night sweats, unintentional weight loss, chest pain, palpitations, wheezing, dyspnea on exertion, nausea, vomiting, dysuria, hematuria, melena, numbness, weakness, or tingling.   All other systems have been reviewed and were otherwise negative with the exception of those mentioned in the HPI and as above.    PHYSICAL EXAM: Filed Vitals:   06/28/14 1247  BP: 138/62  Pulse: 97  Temp: 99.2 F (37.3 C)  Resp: 20   Filed Vitals:   06/28/14 1247  Height: 5\' 8"  (1.727 m)  Weight: 211 lb (95.709 kg)   Body mass index is 32.09 kg/(m^2).  General: Alert, no acute distress HEENT:  Normocephalic, atraumatic, oropharynx patent. EOMI, PERRLA Cardiovascular:  Regular rate and rhythm, no rubs murmurs or gallops.  No Carotid bruits, radial pulse intact. No pedal edema.  Respiratory: Clear to auscultation bilaterally.  No wheezes, rales, or rhonchi.  No cyanosis, no use of accessory musculature GI: No organomegaly, abdomen is soft and  + tenderness mid-RLQ , positive bowel sounds.  No masses. She has +/- rebound tenderness, she has guarding She is jumping up in pain when  palpated Skin: No rashes. Neurologic: Facial musculature symmetric. Psychiatric: Patient is appropriate throughout our interaction. Lymphatic: No cervical lymphadenopathy Musculoskeletal: Gait intact.   LABS: Results for orders placed or performed in visit on 06/28/14  POCT CBC  Result Value Ref Range   WBC 15.8 (A) 4.6 - 10.2 K/uL   Lymph, poc 1.8 0.6 - 3.4   POC LYMPH PERCENT 11.1 10 - 50 %L   MID (cbc) 0.8 0 - 0.9   POC MID % 4.9 0 - 12 %M   POC Granulocyte 13.3 (A) 2 - 6.9   Granulocyte percent 84.0 (A) 37 - 80 %G   RBC 4.03 (A) 4.04 - 5.48 M/uL   Hemoglobin 12.1 (A) 12.2 - 16.2 g/dL   HCT, POC 36.3 (A) 37.7 - 47.9 %   MCV 90.3 80 - 97 fL   MCH, POC 30.0 27 - 31.2 pg   MCHC 33.3 31.8 - 35.4 g/dL   RDW, POC 13.6 %   Platelet Count,  POC 308 142 - 424 K/uL   MPV 6.7 0 - 99.8 fL  POCT urinalysis dipstick  Result Value Ref Range   Color, UA lt. yellow    Clarity, UA clear    Glucose, UA neg    Bilirubin, UA neg    Ketones, UA neg    Spec Grav, UA 1.015    Blood, UA trace-lysed    pH, UA 7.0    Protein, UA neg    Urobilinogen, UA 0.2    Nitrite, UA neg    Leukocytes, UA Negative   POCT UA - Microscopic Only  Result Value Ref Range   WBC, Ur, HPF, POC 0-1    RBC, urine, microscopic neg    Bacteria, U Microscopic neg    Mucus, UA neg    Epithelial cells, urine per micros 0-1    Crystals, Ur, HPF, POC neg    Casts, Ur, LPF, POC neg    Yeast, UA neg    Renal tubular cells 0-1      EKG/XRAY:   Primary read interpreted by Dr. Marin Comment at [facility].   ASSESSMENT/PLAN: Encounter Diagnoses  Name Primary?  . RLQ abdominal pain Yes  . Leukocytosis   . Hematuria    Rule out appendicitis vs renal stone Will send to ER for further evaluation, notified [facility] triage Rx for flomax and norco cancelled , she needs to be evaluated in ER Fu prn   Gross sideeffects, risk and benefits, and alternatives of medications d/w patient. Patient is aware that all medications have potential sideeffects and we are unable to predict every sideeffect or drug-drug interaction that may occur.  Alechia Lezama, [place], DO 06/28/2014 2:49 PM

## 2014-06-28 NOTE — Anesthesia Preprocedure Evaluation (Signed)
Anesthesia Evaluation    History of Anesthesia Complications Negative for: history of anesthetic complications  Airway        Dental   Pulmonary sleep apnea ,          Cardiovascular hypertension,     Neuro/Psych negative neurological ROS  negative psych ROS   GI/Hepatic Neg liver ROS,   Endo/Other    Renal/GU negative Renal ROS     Musculoskeletal   Abdominal   Peds  Hematology negative hematology ROS (+)   Anesthesia Other Findings   Reproductive/Obstetrics                             Anesthesia Physical Anesthesia Plan  ASA: II and emergent  Anesthesia Plan: General   Post-op Pain Management:    Induction: Intravenous, Rapid sequence and Cricoid pressure planned  Airway Management Planned: Oral ETT  Additional Equipment:   Intra-op Plan:   Post-operative Plan: Extubation in OR  Informed Consent:   Plan Discussed with:   Anesthesia Plan Comments:         Anesthesia Quick Evaluation

## 2014-06-29 ENCOUNTER — Encounter (HOSPITAL_COMMUNITY): Payer: Self-pay | Admitting: General Surgery

## 2014-06-29 LAB — CBC
HCT: 35.5 % — ABNORMAL LOW (ref 36.0–46.0)
Hemoglobin: 11.6 g/dL — ABNORMAL LOW (ref 12.0–15.0)
MCH: 30 pg (ref 26.0–34.0)
MCHC: 32.7 g/dL (ref 30.0–36.0)
MCV: 91.7 fL (ref 78.0–100.0)
Platelets: 250 10*3/uL (ref 150–400)
RBC: 3.87 MIL/uL (ref 3.87–5.11)
RDW: 13.4 % (ref 11.5–15.5)
WBC: 11.7 10*3/uL — ABNORMAL HIGH (ref 4.0–10.5)

## 2014-06-29 LAB — BASIC METABOLIC PANEL
Anion gap: 6 (ref 5–15)
BUN: 10 mg/dL (ref 6–23)
CO2: 29 mmol/L (ref 19–32)
Calcium: 8.5 mg/dL (ref 8.4–10.5)
Chloride: 103 mmol/L (ref 96–112)
Creatinine, Ser: 0.76 mg/dL (ref 0.50–1.10)
GFR calc Af Amer: >90 mL/min (ref >90)
GFR calc non Af Amer: 89 mL/min — ABNORMAL LOW (ref >90)
GLUCOSE: 175 mg/dL — AB (ref 70–99)
POTASSIUM: 3.7 mmol/L (ref 3.5–5.1)
SODIUM: 138 mmol/L (ref 135–145)

## 2014-06-29 MED ORDER — HYDROCODONE-ACETAMINOPHEN 5-325 MG PO TABS: 1.0000 | ORAL_TABLET | Freq: Four times a day (QID) | ORAL | Status: DC | PRN

## 2014-06-29 MED ORDER — OXYCODONE-ACETAMINOPHEN 5-325 MG PO TABS: 1.0000 | ORAL_TABLET | Freq: Four times a day (QID) | ORAL | Status: DC | PRN

## 2014-06-29 MED ORDER — HYDROCODONE-ACETAMINOPHEN 5-325 MG PO TABS
1.0000 | ORAL_TABLET | ORAL | Status: DC | PRN
Start: 2014-06-29 — End: 2014-06-29

## 2014-06-29 NOTE — Discharge Instructions (Signed)

## 2014-06-29 NOTE — Discharge Summary (Signed)
[facility] Discharge Summary   Patient ID: Robin Mills MRN: [id number] DOB/AGE: 1951-01-24 64 y.o.  Admit date: 06/28/2014 Discharge date: 06/29/2014  Admitting Diagnosis: Acute appendicitis  Discharge Diagnosis Patient Active Problem List   Diagnosis Date Noted  . Acute appendicitis with localized peritonitis 06/28/2014    Consultants None  Imaging: Ct Abdomen Pelvis W Contrast  06/28/2014   CLINICAL DATA:  Right lower quadrant abdominal pain.  Nausea.  EXAM: CT ABDOMEN AND PELVIS WITH CONTRAST  TECHNIQUE: Multidetector CT imaging of the abdomen and pelvis was performed using the standard protocol following bolus administration of intravenous contrast.  CONTRAST:  168mL OMNIPAQUE IOHEXOL 300 MG/ML SOLN, 73mL OMNIPAQUE IOHEXOL 300 MG/ML SOLN  COMPARISON:  None.  FINDINGS: Lower chest: Left lower lobe scratch that mild left lower lobe atelectasis. There is also dependent subsegmental atelectasis in both lower lobes and in the lingula. Mild cylindrical bronchiectasis is present in both lower lobes.  Hepatobiliary: 5 mm hypodense lesion in segment 4a of the liver, technically too small to characterize although statistically likely to be a small cyst or hemangioma. Similar 3 mm lesion in segment for PE.  Dependent density in the gallbladder may reflect sludge or gallstones.  Pancreas: Unremarkable  Spleen: Unremarkable  Adrenals/Urinary Tract: Unremarkable  Stomach/Bowel: There is acute appendicitis, appendiceal diameter 1.1 cm, with enhancing mucosa and periappendiceal stranding. No findings of rupture or abscess.  Scattered colonic diverticula present. There is several duodenal diverticula which did not appear inflamed.  Vascular/Lymphatic: Unremarkable  Reproductive: Uterus absent.  Ovaries not well seen.  Other: No supplemental non-categorized findings.  Musculoskeletal: Small umbilical hernia contains adipose tissue. Lumbar spondylosis and degenerative disc disease  observed.  IMPRESSION: 1. Acute appendicitis with appendiceal wall thickening, abnormal enhancement, and surrounding inflammatory stranding. No extraluminal gas or abscess. 2. Mild bibasilar atelectasis. Cylindrical bronchiectasis in both lower lobes. 3. Mild sigmoid colon diverticulosis. 4. Small umbilical hernia contains adipose tissue. 5. Several tiny hypodense lesions in segment 4 of the liver, technically too small to characterize although statistically likely to be small cysts or hemangiomas.   Electronically Signed   By: Van Clines M.D.   On: 06/28/2014 18:26    Procedures Dr. Excell Seltzer (06/28/14) - Laparoscopic Appendectomy  Hospital Course:  64 y/o female who presented to [facility] with gradual onset 18 hours ago of right lower quadrant abdominal pain. This worsened last night and through the day today. The pain became much worse with any movement and she would feel lightheaded and dizzy when she tried to get up and move around. She developed nausea without vomiting this morning. Also felt chilled and low-grade fever. She presented to urgent care and then was sent here to the emergency department for further evaluation. No vomiting. No change in bowel habits. No urinary symptoms. No previous history of similar symptoms or chronic GI complaints.  Workup showed acute appendicitis.  Patient was admitted and underwent procedure listed above.  Tolerated procedure well and was transferred to the floor.  Diet was advanced as tolerated.  On POD #1, the patient was voiding well, tolerating diet, ambulating well, pain well controlled, vital signs stable, incisions c/d/i and felt stable for discharge home.  Patient will follow up in our office in 3 weeks and knows to call with questions or concerns.   Physical Exam: General:  Alert, NAD, pleasant, comfortable Abd:  Soft, ND, mild tenderness, incisions C/D/I     Medication List    TAKE these medications  HYDROcodone-acetaminophen 5-325 MG  per tablet  Commonly known as:  NORCO/VICODIN  Take 1-2 tablets by mouth every 6 (six) hours as needed for moderate pain or severe pain.     losartan-hydrochlorothiazide 100-25 MG per tablet  Commonly known as:  HYZAAR  Take 1 tablet by mouth daily.     tamsulosin 0.4 MG Caps capsule  Commonly known as:  FLOMAX  Take 1 capsule (0.4 mg total) by mouth daily.     tetracycline 500 MG capsule  Commonly known as:  ACHROMYCIN,SUMYCIN  Take 500 mg by mouth daily.     venlafaxine XR 37.5 MG 24 hr capsule  Commonly known as:  EFFEXOR-XR  Take 37.5 mg by mouth daily with breakfast.         Follow-up Information    Follow up with [facility] [place] On 07/21/2014.   Why:  For post-operation check. Your appointment is at 3:45p, please arrive at least 30 min before your appointment to complete your check in paperwork.  If you are unable to arrive 30 min prior to your appointment time we may have to cancel or reschedule you.   Contact information:   [address] [id number] [phone]      Signed: Excell Seltzer [facility] [phone]  06/29/2014, 10:27 AM  Agree with above. Feels better. Her daughter is in the room.  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 She is ready to go home.  Alphonsa Overall, MD, [facility] Pager: [phone] Office phone:  [phone]

## 2014-06-29 NOTE — Progress Notes (Signed)
UR completed 

## 2014-06-29 NOTE — Progress Notes (Signed)
Discharge instructions and prescription given to patient.  Questions answered.  Daughter at bedside to drive mother home.

## 2014-06-29 NOTE — Progress Notes (Signed)
CPAP set up and patient placed on auto titrate via nasal mask with 2 lpm O2 bleed in.  Patient stable and tolerating well.

## 2014-07-12 ENCOUNTER — Encounter: Payer: Self-pay | Admitting: Family Medicine

## 2014-07-27 ENCOUNTER — Ambulatory Visit
Admission: RE | Admit: 2014-07-27 | Discharge: 2014-07-27 | Disposition: A | Discharge status: Home / Self Care | Payer: BC Managed Care – PPO | Source: Ambulatory Visit

## 2014-07-27 DIAGNOSIS — Z1231 Encounter for screening mammogram for malignant neoplasm of breast: Secondary | ICD-10-CM

## 2015-05-24 DIAGNOSIS — E669 Obesity, unspecified: Secondary | ICD-10-CM | POA: Insufficient documentation

## 2015-05-24 DIAGNOSIS — K219 Gastro-esophageal reflux disease without esophagitis: Secondary | ICD-10-CM | POA: Insufficient documentation

## 2015-08-23 ENCOUNTER — Encounter: Payer: Self-pay | Admitting: Neurology

## 2015-08-23 ENCOUNTER — Telehealth: Payer: Self-pay | Admitting: Neurology

## 2015-08-23 ENCOUNTER — Institutional Professional Consult (permissible substitution): Payer: BC Managed Care – PPO | Admitting: Neurology

## 2015-08-23 NOTE — Telephone Encounter (Signed)
Given work phone number is for [employer] and nobody by her name is known to the Network engineer. CD

## 2015-10-04 ENCOUNTER — Institutional Professional Consult (permissible substitution): Payer: BC Managed Care – PPO | Admitting: Neurology

## 2015-10-11 ENCOUNTER — Telehealth: Payer: Self-pay

## 2015-10-11 ENCOUNTER — Institutional Professional Consult (permissible substitution): Payer: BC Managed Care – PPO | Admitting: Neurology

## 2015-10-11 NOTE — Telephone Encounter (Signed)
Patient called at 2:30 (time of appt) states that she was in a meeting that ran over and is in [employer]. Patient was advised by phone room to reschedule.

## 2015-10-12 ENCOUNTER — Encounter: Payer: Self-pay | Admitting: Neurology

## 2015-10-29 ENCOUNTER — Encounter: Payer: Self-pay | Admitting: Internal Medicine

## 2015-10-29 ENCOUNTER — Encounter: Payer: Self-pay | Admitting: Gastroenterology

## 2015-11-03 ENCOUNTER — Ambulatory Visit (INDEPENDENT_AMBULATORY_CARE_PROVIDER_SITE_OTHER): Payer: BC Managed Care – PPO | Admitting: Neurology

## 2015-11-03 ENCOUNTER — Encounter: Payer: Self-pay | Admitting: Neurology

## 2015-11-03 VITALS — BP 104/60 | HR 72 | Resp 16 | Ht 68.0 in | Wt 211.0 lb

## 2015-11-03 DIAGNOSIS — R351 Nocturia: Secondary | ICD-10-CM | POA: Diagnosis not present

## 2015-11-03 DIAGNOSIS — G4733 Obstructive sleep apnea (adult) (pediatric): Secondary | ICD-10-CM

## 2015-11-03 DIAGNOSIS — G4710 Hypersomnia, unspecified: Secondary | ICD-10-CM | POA: Diagnosis not present

## 2015-11-03 DIAGNOSIS — E669 Obesity, unspecified: Secondary | ICD-10-CM

## 2015-11-03 DIAGNOSIS — Z9989 Dependence on other enabling machines and devices: Principal | ICD-10-CM

## 2015-11-03 NOTE — Progress Notes (Signed)
Subjective:    Patient ID: Robin Mills is a 65 y.o. female.  HPI     Star Age, MD, PhD [facility] [address] [place], [place] [address]  Dear Dr. Ardeth Perfect,   I saw your patient, Robin Mills, upon your kind request in my neurologic clinic today for initial consultation of her sleep disorder, in particular, evaluation of her prior diagnosis of OSA and CPAP therapy. The patient is unaccompanied today. Of note, patient no showed for an appointment on 10/11/2015. As you know, Robin Mills is a 65 year old right-handed woman with an underlying medical history of hypertension, anxiety, rosacea, reflux disease, status post knee surgery, status post appendectomy in 2016, and obesity, who was diagnosed with obstructive sleep apnea and placed on CPAP therapy many years ago, last evaluation about 5-6 years ago. Her sleep study was conducted out of state, in [place] and results are not available for my review today. I reviewed CPAP compliance data from 10/04/2015 through 11/02/2015 which is a total of 30 days during which time she used her machine 29 days with percent used days greater than 4 hours at 96.7%, indicating excellent compliance with an average usage of 7 hours and 55 minutes. AHI information is not available, leak information is also not available on this download, her CPAP machine is an older model, Development worker, community Plus.  I reviewed your office note from 05/24/2015. [place]. She is divorced with 2 children (Daughter in [place] and Son is in [place]), she works as a Agricultural engineer.  She lives with her elderly mother and moved to be close to family and after her father died.  She has gained about 40 lb in the last 5 years.  Her original sleep apnea diagnosis goes back several years. She is currently on her second CPAP machine. She does have an older mask as well. She has been using this last mask  for about 6 months now. She needs an updated CPAP machine, updated diagnosis and updated supplies. In particular, in light of her weight gain which has been significant she is in need for reevaluation. She is working with her sister on her weight loss and has been more active physically in the last 10 days and more mindful about her caloric intake. She drinks caffeine in the form of coffee him about 2 cups per day, wine on weekends. She denies restless leg symptoms or leg twitching at night. She denies morning headaches. She has nocturia, about twice per night on average.  Her Past Medical History Is Significant For: Past Medical History  Diagnosis Date  . Hypertension   . Sleep apnea   . Anxiety   . Rosacea   . GERD (gastroesophageal reflux disease)   . Obesity     Her Past Surgical History Is Significant For: Past Surgical History  Procedure Laterality Date  . Abdominal hysterectomy    . Knee surgery    . Cardaic cath      2010-normal  . Laparoscopic appendectomy N/A 06/28/2014    Procedure: APPENDECTOMY LAPAROSCOPIC;  Surgeon: Excell Seltzer, MD;  Location: [facility];  Service: General;  Laterality: N/A;  . Umbilical hernia repair N/A 06/28/2014    Procedure: HERNIA REPAIR UMBILICAL ADULT;  Surgeon: Excell Seltzer, MD;  Location: [facility];  Service: General;  Laterality: N/A;    Her Family History Is Significant For: Family History  Problem Relation Age of Onset  . Heart attack Mother   .  Heart attack Paternal Grandfather   . Alzheimer's disease Father   . Sleep apnea Father   . Sleep apnea Brother   . Stroke Maternal Grandmother     His Social History Is Significant For: Social History   Social History  . Marital Status: Single    Spouse Name: N/A  . Number of Children: 2  . Years of Education: college   Occupational History  . [place] History Main Topics  . Smoking status: Never Smoker   . Smokeless tobacco: None  . Alcohol Use: 0.0  oz/week    0 Standard drinks or equivalent per week     Comment: social  . Drug Use: No  . Sexual Activity: Not Asked   Other Topics Concern  . None   Social History Narrative   Drinks 2 cups of coffee a day     Her Allergies Are:  Allergies  Allergen Reactions  . Sulfa Antibiotics Hives  :   His Current Medications Are:  Outpatient Encounter Prescriptions as of 11/03/2015  Medication Sig  . aspirin 325 MG tablet Take 325 mg by mouth as needed.  Marland Kitchen losartan-hydrochlorothiazide (HYZAAR) 100-25 MG per tablet Take 1 tablet by mouth daily.   Marland Kitchen omeprazole (PRILOSEC) 40 MG capsule Take 40 mg by mouth daily.  Marland Kitchen tetracycline (ACHROMYCIN,SUMYCIN) 500 MG capsule Take 500 mg by mouth daily.   Marland Kitchen venlafaxine XR (EFFEXOR-XR) 37.5 MG 24 hr capsule Take 37.5 mg by mouth daily with breakfast.   . [DISCONTINUED] HYDROcodone-acetaminophen (NORCO/VICODIN) 5-325 MG per tablet Take 1-2 tablets by mouth every 6 (six) hours as needed for moderate pain or severe pain.  . [DISCONTINUED] tamsulosin (FLOMAX) 0.4 MG CAPS capsule Take 1 capsule (0.4 mg total) by mouth daily.   No facility-administered encounter medications on file as of 11/03/2015.  :  Review of Systems:  Out of a complete 14 point review of systems, all are reviewed and negative with the exception of these symptoms as listed below:   Review of Systems  Neurological:       Patient states that she has some trouble falling asleep, nocturia, witnessed apnea, daytime tiredness.  Patient currently uses CPAP. Last sleep study was 5-6 years ago. Study done in [place], unable to get records.   Epworth Sleepiness Scale 0= would never doze 1= slight chance of dozing 2= moderate chance of dozing 3= high chance of dozing  Sitting and reading:0 Watching TV:1 Sitting inactive in a public place (ex. Theater or meeting):0 As a passenger in a car for an hour without a break:1 Lying down to rest in the afternoon:3 Sitting and talking to someone:0 Sitting  quietly after lunch (no alcohol):1 In a car, while stopped in traffic:0 Total:6   Objective:  Neurologic Exam  Physical Exam Physical Examination:   Filed Vitals:   11/03/15 1010  BP: 104/60  Pulse: 72  Resp: 16   General Examination: The patient is a very pleasant 65 y.o. female in no acute distress. She appears well-developed and well-nourished and very well groomed.  HEENT: Normocephalic, atraumatic, pupils are equal, round and reactive to light and accommodation. Funduscopic exam is normal with sharp disc margins noted. Extraocular tracking is good without limitation to gaze excursion or nystagmus noted. Normal smooth pursuit is noted. Hearing is grossly intact. Tympanic membranes are clear bilaterally. Face is symmetric with normal facial animation and normal facial sensation. Speech is clear with no dysarthria noted. There is no hypophonia. There is no lip,  neck/head, jaw or voice tremor. Neck is supple with full range of passive and active motion. There are no carotid bruits on auscultation. Oropharynx exam reveals: mild mouth dryness, good dental hygiene and moderate airway crowding, due to thicker soft palate and redundant soft palate. Mallampati is class II. Tongue protrudes centrally and palate elevates symmetrically. Tonsils are small in size. Neck size is 16 5/8 inches. She has a Mild overbite.    Chest: Clear to auscultation without wheezing, rhonchi or crackles noted.  Heart: S1+S2+0, regular and normal without murmurs, rubs or gallops noted.   Abdomen: Soft, non-tender and non-distended with normal bowel sounds appreciated on auscultation.  Extremities: There is no pitting edema in the distal lower extremities bilaterally. Pedal pulses are intact.  Skin: Warm and dry without trophic changes noted. There are no varicose veins.  Musculoskeletal: exam reveals no obvious joint deformities, tenderness or joint swelling or erythema.   Neurologically:  Mental status: The  patient is awake, alert and oriented in all 4 spheres. Her immediate and remote memory, attention, language skills and fund of knowledge are appropriate. There is no evidence of aphasia, agnosia, apraxia or anomia. Speech is clear with normal prosody and enunciation. Thought process is linear. Mood is normal and affect is normal.  Cranial nerves II - XII are as described above under HEENT exam. In addition: shoulder shrug is normal with equal shoulder height noted. Motor exam: Normal bulk, strength and tone is noted. There is no drift, tremor or rebound. Romberg is negative. Reflexes are 2+ throughout. Fine motor skills and coordination: intact with normal finger taps, normal hand movements, normal rapid alternating patting, normal foot taps and normal foot agility.  Cerebellar testing: No dysmetria or intention tremor on finger to nose testing. Heel to shin is unremarkable bilaterally. There is no truncal or gait ataxia.  Sensory exam: intact to light touch, pinprick, vibration, temperature sense in the upper and lower extremities.  Gait, station and balance: She stands easily. No veering to one side is noted. No leaning to one side is noted. Posture is age-appropriate and stance is narrow based. Gait shows normal stride length and normal pace. No problems turning are noted. Tandem walk is unremarkable.    Assessment and Plan:   In summary, Robin Mills is a very pleasant 65 y.o.-year old female with an underlying medical history of hypertension, anxiety, rosacea, reflux disease, status post knee surgery, status post appendectomy in 2016, and obesity, whose history and physical exam are in keeping with obstructive sleep apnea (OSA). She has a long-standing history of OSA, last evaluation was 5 or 6 years ago in [place], but she has had interim significant weight gain of about 40 pounds. I had a long chat with the patient about my findings and the diagnosis of OSA, its prognosis and treatment options.  We talked about medical treatments, surgical interventions and non-pharmacological approaches. I explained in particular the risks and ramifications of untreated moderate to severe OSA, especially with respect to developing cardiovascular disease down the Road, including congestive heart failure, difficult to treat hypertension, cardiac arrhythmias, or stroke. Even type 2 diabetes has, in part, been linked to untreated OSA. Symptoms of untreated OSA include daytime sleepiness, memory problems, mood irritability and mood disorder such as depression and anxiety, lack of energy, as well as recurrent headaches, especially morning headaches. We talked about trying to maintain a healthy lifestyle in general, as well as the importance of weight control. I encouraged the patient to eat healthy, exercise daily  and keep well hydrated, to keep a scheduled bedtime and wake time routine, to not skip any meals and eat healthy snacks in between meals. I advised the patient not to drive when feeling sleepy. I recommended the following at this time: sleep study with potential positive airway pressure titration. (We will score hypopneas at 3% and split the sleep study into diagnostic and treatment portion, if the estimated. 2 hour AHI is >15/h).   I explained the sleep test procedure to the patient and also outlined possible surgical and non-surgical treatment options of OSA, including the use of a custom-made dental device (which would require a referral to a specialist dentist or oral surgeon), upper airway surgical options, such as pillar implants, radiofrequency surgery, tongue base surgery, and UPPP (which would involve a referral to an ENT surgeon). Rarely, jaw surgery such as mandibular advancement may be considered.  I also explained the CPAP treatment option to the patient, who indicated that she would be willing to continue with CPAP. I explained the importance of being compliant with PAP treatment, not only for  insurance purposes but primarily to improve Her symptoms, and for the patient's long term health benefit, including to reduce Her cardiovascular risks. I answered all her questions today and the patient was in agreement. I would like to see her back after the sleep study is completed and encouraged her to call with any interim questions, concerns, problems or updates.   Thank you very much for allowing me to participate in the care of this nice patient. If I can be of any further assistance to you please do not hesitate to call me at [phone].  Sincerely,   Star Age, MD, PhD

## 2015-11-03 NOTE — Patient Instructions (Signed)
Based on your history and exam, you have obstructive sleep apnea (OSA). We should proceed with a sleep study to determine how severe it is and to get you updated supplies and optimize your treatment setting. Please remember, the risks and ramifications of moderate to severe obstructive sleep apnea or OSA are: Cardiovascular disease, including congestive heart failure, stroke, difficult to control hypertension, arrhythmias, and even type 2 diabetes has been linked to untreated OSA. Sleep apnea causes disruption of sleep and sleep deprivation in most cases, which, in turn, can cause recurrent headaches, problems with memory, mood, concentration, focus, and vigilance. Most people with untreated sleep apnea report excessive daytime sleepiness, which can affect their ability to drive. Please do not drive if you feel sleepy.   I will likely see you back after your sleep study to go over the test results and where to go from there. We will call you after your sleep study to advise about the results (most likely, you will hear from Beverlee Nims, my nurse) and to set up an appointment at the time, as necessary.    Our sleep lab administrative assistant, Arrie Aran will meet with you or call you to schedule your sleep study. If you don't hear back from her by next week please feel free to call her at [phone]. This is her direct line and please leave a message with your phone number to call back if you get the voicemail box. She will call back as soon as possible.

## 2015-11-23 ENCOUNTER — Ambulatory Visit (INDEPENDENT_AMBULATORY_CARE_PROVIDER_SITE_OTHER): Payer: BC Managed Care – PPO | Admitting: Neurology

## 2015-11-23 DIAGNOSIS — G4733 Obstructive sleep apnea (adult) (pediatric): Secondary | ICD-10-CM

## 2015-11-23 DIAGNOSIS — G4761 Periodic limb movement disorder: Secondary | ICD-10-CM

## 2015-11-29 ENCOUNTER — Telehealth: Payer: Self-pay | Admitting: Neurology

## 2015-11-29 DIAGNOSIS — G4733 Obstructive sleep apnea (adult) (pediatric): Secondary | ICD-10-CM

## 2015-11-29 NOTE — Telephone Encounter (Signed)
Robin Mills:  Patient referred by Dr. Ardeth Perfect, seen by me on 11/03/15, split study on 11/23/15, Ins: [employer]. Please call and notify patient that the recent sleep study confirmed the diagnosis of moderate OSA. She did very well with CPAP during the study with significant improvement of the respiratory events. Therefore, I will prescribe a new CPAP machine for her. May have existing DME?  The patient will need a follow up appointment with me in 8 to 10 weeks post set up that has to be scheduled; please go ahead and schedule while you have the patient on the phone and make sure patient understands the importance of keeping this window for the FU appointment, as it is often an insurance requirement and failing to adhere to this may result in losing coverage for sleep apnea treatment.  Please re-enforce the importance of compliance with treatment and the need for [place] to monitor compliance data - again an insurance requirement and good feedback for the patient as far as how they are doing.  Also remind patient, that any upcoming CPAP machine or mask issues, should be first addressed with the DME company. Please ask if patient has a preference regarding DME company.  Please arrange for CPAP set up at home through a DME company of patient's choice - once you have spoken to the patient - and faxed/routed report to PCP and referring MD (if other than PCP), you can close this encounter, thanks,   Star Age, MD, PhD [facility] ([place])

## 2015-11-29 NOTE — Telephone Encounter (Signed)
LM for patient to call back for results

## 2015-11-30 NOTE — Telephone Encounter (Signed)
Patient called back and was given results and recommendations below. She voiced understanding. I will send orders to [facility]. I will send report to PCP. I will also send the patient a letter reminding her to make f/u appt and stress the importance of compliance.

## 2015-12-11 ENCOUNTER — Ambulatory Visit (INDEPENDENT_AMBULATORY_CARE_PROVIDER_SITE_OTHER): Payer: BC Managed Care – PPO | Admitting: Physician Assistant

## 2015-12-11 ENCOUNTER — Encounter: Payer: Self-pay | Admitting: Physician Assistant

## 2015-12-11 VITALS — BP 124/80 | HR 78 | Temp 98.5°F | Resp 17 | Ht 67.5 in | Wt 211.0 lb

## 2015-12-11 DIAGNOSIS — R300 Dysuria: Secondary | ICD-10-CM

## 2015-12-11 DIAGNOSIS — N3001 Acute cystitis with hematuria: Secondary | ICD-10-CM | POA: Diagnosis not present

## 2015-12-11 LAB — POCT URINALYSIS DIP (MANUAL ENTRY)
Bilirubin, UA: NEGATIVE
Glucose, UA: NEGATIVE
Ketones, POC UA: NEGATIVE
Nitrite, UA: NEGATIVE
Protein Ur, POC: 100 — AB
Spec Grav, UA: 1.025
Urobilinogen, UA: 0.2
pH, UA: 5

## 2015-12-11 LAB — POC MICROSCOPIC URINALYSIS (UMFC): Mucus: ABSENT

## 2015-12-11 MED ORDER — NITROFURANTOIN MONOHYD MACRO 100 MG PO CAPS: 100.0000 mg | ORAL_CAPSULE | Freq: Two times a day (BID) | ORAL | 0 refills | Status: AC

## 2015-12-11 NOTE — Patient Instructions (Addendum)
  Finish all your abx Push all fluids   IF you received an x-ray today, you will receive an invoice from [facility]. Please contact [facility] at [phone] with questions or concerns regarding your invoice.   IF you received labwork today, you will receive an invoice from [employer]. Please contact [facility] at [phone] with questions or concerns regarding your invoice.   Our billing staff will not be able to assist you with questions regarding bills from these companies.  You will be contacted with the lab results as soon as they are available. The fastest way to get your results is to activate your My Chart account. Instructions are located on the last page of this paperwork. If you have not heard from [place] regarding the results in 2 weeks, please contact this office.

## 2015-12-11 NOTE — Progress Notes (Signed)
Robin Mills  MRN: [id number] DOB: 1950-06-29  Subjective:  Pt presents to clinic with increased bladder pressure, urgency/frequency, decreased urine volume, and burning with urination. Symptoms started this morning. She did noticed some blood this am. She took a leftover dose of cipro that she had in her cabinet this AM. She's had bladder infections in the past, but none in the last few years. Not currently sexually active, but a pap done on Wednesday.   Review of Systems  Constitutional: Negative for chills and fever.  Gastrointestinal: Positive for abdominal pain (pressure). Negative for nausea.  Genitourinary: Positive for dysuria, frequency and urgency. Negative for flank pain, hematuria and vaginal discharge.  Musculoskeletal: Negative for back pain.  All other systems reviewed and are negative.   Patient Active Problem List   Diagnosis Date Noted  . Acute appendicitis with localized peritonitis 06/28/2014    Current Outpatient Prescriptions on File Prior to Visit  Medication Sig Dispense Refill  . aspirin 325 MG tablet Take 325 mg by mouth as needed.    Marland Kitchen losartan-hydrochlorothiazide (HYZAAR) 100-25 MG per tablet Take 1 tablet by mouth daily.   7  . omeprazole (PRILOSEC) 40 MG capsule Take 40 mg by mouth daily.    Marland Kitchen tetracycline (ACHROMYCIN,SUMYCIN) 500 MG capsule Take 500 mg by mouth daily.   3  . venlafaxine XR (EFFEXOR-XR) 37.5 MG 24 hr capsule Take 37.5 mg by mouth daily with breakfast.   3   No current facility-administered medications on file prior to visit.     Allergies  Allergen Reactions  . Sulfa Antibiotics Hives    Pt patients past, family and social history were reviewed and updated.  Objective:  BP 124/80 (BP Location: Right Arm, Patient Position: Sitting, Cuff Size: Normal)   Pulse 78   Temp 98.5 F (36.9 C) (Oral)   Resp 17   Ht 5' 7.5" (1.715 m)   Wt 211 lb (95.7 kg)   SpO2 98%   BMI 32.56 kg/m   Physical Exam  Constitutional: She is  oriented to person, place, and time and well-developed, well-nourished, and in no distress. She appears not dehydrated.  Non-toxic appearance.  HENT:  Head: Normocephalic and atraumatic.  Right Ear: External ear normal.  Left Ear: External ear normal.  Eyes: Conjunctivae and lids are normal. No scleral icterus.  Neck: Neck supple.  Cardiovascular: Normal rate, regular rhythm and normal heart sounds.   No murmur heard. Pulmonary/Chest: Effort normal and breath sounds normal.  Abdominal: Soft. There is no tenderness. There is no CVA tenderness.  Musculoskeletal: Normal range of motion.  Lymphadenopathy:    She has no cervical adenopathy.  Neurological: She is alert and oriented to person, place, and time. Gait normal.  Skin: Skin is warm, dry and intact. She is not diaphoretic. No pallor.  Psychiatric: Mood, memory, affect and judgment normal.  Vitals reviewed.  Results for orders placed or performed in visit on 12/11/15  POCT Microscopic Urinalysis ([facility])  Result Value Ref Range   WBC,UR,HPF,POC Too numerous to count  (A) None WBC/hpf   RBC,UR,HPF,POC Too numerous to count  (A) None RBC/hpf   Bacteria Moderate (A) None, Too numerous to count   Mucus Absent Absent   Epithelial Cells, UR Per Microscopy Few (A) None, Too numerous to count cells/hpf  POCT urinalysis dipstick  Result Value Ref Range   Color, UA yellow yellow   Clarity, UA cloudy (A) clear   Glucose, UA negative negative   Bilirubin, UA negative negative  Ketones, POC UA negative negative   Spec Grav, UA 1.025    Blood, UA large (A) negative   pH, UA 5.0    Protein Ur, POC =100 (A) negative   Urobilinogen, UA 0.2    Nitrite, UA Negative Negative   Leukocytes, UA large (3+) (A) Negative   Assessment and Plan :  Dysuria - Plan: POCT Microscopic Urinalysis ([facility]), POCT urinalysis dipstick, Urine culture  Acute cystitis with hematuria - Plan: nitrofurantoin, macrocrystal-monohydrate, (MACROBID) 100 MG capsule    Push fluids, finish abx.  OK to use OTC AZO   Windell Hummingbird PA-C  [facility] 12/11/2015 2:28 PM

## 2015-12-13 LAB — URINE CULTURE: Organism ID, Bacteria: NO GROWTH

## 2015-12-17 ENCOUNTER — Telehealth: Payer: Self-pay

## 2015-12-17 NOTE — Telephone Encounter (Signed)
Pt was recently seen for bladder infection and the medication has helpted but still feeling pressure-she was told to call back and we would call in cipro for her if needed   Best number [phone]

## 2015-12-20 ENCOUNTER — Encounter: Payer: Self-pay | Admitting: *Deleted

## 2015-12-20 NOTE — Telephone Encounter (Signed)
Left message for pt to call back  °

## 2015-12-20 NOTE — Telephone Encounter (Signed)
As is noted in  My results note - her urine did not grow out bacteria so if she still is having symptoms she needs to be seen again

## 2015-12-21 NOTE — Telephone Encounter (Signed)
Spoke with pt and she stated does not have time to come in so she will not worry about it

## 2016-01-05 ENCOUNTER — Ambulatory Visit: Payer: BC Managed Care – PPO | Admitting: Gastroenterology

## 2016-01-06 ENCOUNTER — Ambulatory Visit (INDEPENDENT_AMBULATORY_CARE_PROVIDER_SITE_OTHER): Payer: Worker's Compensation

## 2016-01-06 ENCOUNTER — Encounter (HOSPITAL_COMMUNITY): Payer: Self-pay | Admitting: Emergency Medicine

## 2016-01-06 ENCOUNTER — Ambulatory Visit (HOSPITAL_COMMUNITY)
Admission: EM | Admit: 2016-01-06 | Discharge: 2016-01-06 | Disposition: A | Discharge status: Home / Self Care | Payer: Worker's Compensation | Attending: Internal Medicine | Admitting: Internal Medicine

## 2016-01-06 DIAGNOSIS — S8991XA Unspecified injury of right lower leg, initial encounter: Secondary | ICD-10-CM

## 2016-01-06 DIAGNOSIS — M25561 Pain in right knee: Secondary | ICD-10-CM

## 2016-01-06 MED ORDER — DICLOFENAC SODIUM 75 MG PO TBEC: 75.0000 mg | DELAYED_RELEASE_TABLET | Freq: Two times a day (BID) | ORAL | 0 refills | Status: DC

## 2016-01-06 MED ORDER — HYDROCODONE-ACETAMINOPHEN 5-325 MG PO TABS: 2.0000 | ORAL_TABLET | ORAL | 0 refills | Status: DC | PRN

## 2016-01-06 MED ORDER — HYDROCODONE-ACETAMINOPHEN 7.5-325 MG PO TABS: 1.0000 | ORAL_TABLET | Freq: Four times a day (QID) | ORAL | 0 refills | Status: DC | PRN

## 2016-01-06 NOTE — ED Triage Notes (Signed)
Pt here for right knee inj onset today while at work... Reports she fell onto cement flooring  Sx include: swelling, pain  Brought back on wheelchair  A&O x4... NAD

## 2016-01-06 NOTE — ED Provider Notes (Signed)
CSN: [id number]     Arrival date & time 01/06/16  1905 History   First MD Initiated Contact with Patient 01/06/16 2046     Chief Complaint  Patient presents with  . 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 Other symptoms include: Limping, painful to walk Pain score: 4 FAMILY HISTORY: Hypertension    Past Medical History:  Diagnosis Date  . Anxiety   . GERD (gastroesophageal reflux disease)   . Hypertension   . Obesity   . Rosacea   . Sleep apnea    Past Surgical History:  Procedure Laterality Date  . ABDOMINAL HYSTERECTOMY    . cardaic cath     2010-normal  . KNEE SURGERY    . LAPAROSCOPIC APPENDECTOMY N/A 06/28/2014   Procedure: APPENDECTOMY LAPAROSCOPIC;  Surgeon: Excell Seltzer, MD;  Location: [facility];  Service: General;  Laterality: N/A;  . UMBILICAL HERNIA REPAIR N/A 06/28/2014   Procedure: HERNIA REPAIR UMBILICAL ADULT;  Surgeon: Excell Seltzer, MD;  Location: [facility];  Service: General;  Laterality: N/A;   Family History  Problem Relation Age of Onset  . Heart attack Mother   . Alzheimer's disease Father   . Sleep apnea Father   . Heart attack Paternal Grandfather   . Sleep apnea Brother   . Cancer Brother   . Stroke Maternal Grandmother    Social History  Substance Use Topics  . Smoking status: Never Smoker  . Smokeless tobacco: Never Used  . Alcohol use 0.0 oz/week     Comment: 3-4 per week   OB History    No data available     Review of Systems  Denies: HEADACHE, NAUSEA, ABDOMINAL PAIN, CHEST PAIN, CONGESTION, DYSURIA, SHORTNESS OF BREATH  Allergies  Sulfa antibiotics  Home Medications   Prior to Admission  medications   Medication Sig Start Date End Date Taking? Authorizing Provider  losartan-hydrochlorothiazide (HYZAAR) 100-25 MG per tablet Take 1 tablet by mouth daily.  05/18/14  Yes Historical Provider, MD  omeprazole (PRILOSEC) 40 MG capsule Take 40 mg by mouth daily.   Yes Historical Provider, MD  tetracycline (ACHROMYCIN,SUMYCIN) 500 MG capsule Take 500 mg by mouth daily.  05/18/14  Yes Historical Provider, MD  aspirin 325 MG tablet Take 325 mg by mouth as needed.    Historical Provider, MD  venlafaxine XR (EFFEXOR-XR) 37.5 MG 24 hr capsule Take 37.5 mg by mouth daily with breakfast.  05/18/14   Historical Provider, MD   Meds Ordered and Administered this Visit  Medications - No data to display  BP 143/59 (BP Location: Left Arm)   Pulse 75   Temp 98 F (36.7 C) (Oral)   Resp 16   SpO2 96%  No data found.   Physical Exam NURSES NOTES AND VITAL SIGNS REVIEWED. CONSTITUTIONAL: Well developed, well nourished, no acute distress HEENT: normocephalic, atraumatic EYES: Conjunctiva normal NECK:normal ROM, supple, no adenopathy PULMONARY:No respiratory distress, normal effort ABDOMINAL: Soft, ND, NT BS+, No CVAT MUSCULOSKELETAL: Normal ROM of all extremities, right knee: swollen, tender, effusion. Sensory and motor intact distally. Joint stable to valgus and varus stress.  SKIN: warm and dry without rash PSYCHIATRIC: Mood and affect, behavior are normal  Urgent Care Course   Clinical Course  Procedures (including critical care time)  Labs Review Labs Reviewed - No data to display  Imaging Review Dg Knee Complete 4 Views Right  Result Date: 01/06/2016 CLINICAL DATA:  Fall EXAM: RIGHT KNEE - COMPLETE 4+ VIEW COMPARISON:  None. FINDINGS: Four views of the right knee submitted. There is narrowing of medial joint compartment periods moderate spurring of medial tibial plateau and medial femoral condyle. Mild spurring of lateral tibial plateau and lateral femoral condyle. Narrowing of  patellofemoral joint space. Small joint effusion. No acute fracture or subluxation. IMPRESSION: No acute fracture or subluxation. Osteoarthritic changes as described above. Electronically Signed   By: Lahoma Crocker M.D.   On: 01/06/2016 21:21   Discussed with patient prior to discharge  Visual Acuity Review  Right Eye Distance:   Left Eye Distance:   Bilateral Distance:    Right Eye Near:   Left Eye Near:    Bilateral Near:         MDM   1. Knee injury, right, initial encounter   2. Knee pain, acute, right     Patient is reassured that there are no issues that require transfer to higher level of care at this time or additional tests. Patient is advised to continue home symptomatic treatment. Patient is advised that if there are new or worsening symptoms to attend the emergency department, contact primary care provider, or return to UC. Instructions of care provided discharged home in stable condition.    THIS NOTE WAS GENERATED USING A VOICE RECOGNITION SOFTWARE PROGRAM. ALL REASONABLE EFFORTS  WERE MADE TO PROOFREAD THIS DOCUMENT FOR ACCURACY.  I have verbally reviewed the discharge instructions with the patient. A printed AVS was given to the patient.  All questions were answered prior to discharge.      Konrad Felix, PA 01/06/16 2133

## 2016-01-08 ENCOUNTER — Telehealth (HOSPITAL_COMMUNITY): Payer: Self-pay | Admitting: Emergency Medicine

## 2016-01-08 NOTE — Telephone Encounter (Signed)
Verifying what medications written for patient.  Aquilla Solian, CMA called patient to verify how patient doing  And how medications are working for her.  Meza, CMA left a message for patient to call [facility]

## 2016-08-03 ENCOUNTER — Encounter: Payer: Self-pay | Admitting: Internal Medicine

## 2016-11-15 ENCOUNTER — Encounter: Payer: Self-pay | Admitting: Internal Medicine

## 2016-12-27 ENCOUNTER — Telehealth: Payer: Self-pay | Admitting: *Deleted

## 2016-12-27 NOTE — Telephone Encounter (Signed)
Patient no show pv today. Called pt, no answer, left message for patient to call us back to reschedule nurse visit before 5 pm today.

## 2017-01-04 ENCOUNTER — Encounter: Payer: Self-pay | Admitting: *Deleted

## 2017-01-04 ENCOUNTER — Ambulatory Visit (AMBULATORY_SURGERY_CENTER): Payer: Self-pay | Admitting: *Deleted

## 2017-01-04 VITALS — Ht 68.0 in | Wt 207.0 lb

## 2017-01-04 DIAGNOSIS — Z1211 Encounter for screening for malignant neoplasm of colon: Secondary | ICD-10-CM

## 2017-01-04 NOTE — Progress Notes (Signed)
No egg or soy allergy known to patient  No issues with past sedation with any surgeries  or procedures, no intubation problems - with wisdom teeth had sodium pentathol and had negative reaction but no other issues    diet pills per patient- takes Qsymia- will hold for 10 days  No home 02 use per patient  No blood thinners per patient  Pt denies issues with constipation  No A fib or A flutter  EMMI video sent to pt's e mail - pt declined

## 2017-01-10 ENCOUNTER — Encounter: Payer: Self-pay | Admitting: Internal Medicine

## 2017-01-16 ENCOUNTER — Encounter: Payer: Self-pay | Admitting: Internal Medicine

## 2017-01-16 ENCOUNTER — Ambulatory Visit (AMBULATORY_SURGERY_CENTER): Payer: BC Managed Care – PPO | Admitting: Internal Medicine

## 2017-01-16 VITALS — BP 124/69 | HR 78 | Temp 99.5°F | Resp 11 | Ht 68.0 in | Wt 207.0 lb

## 2017-01-16 DIAGNOSIS — Z1211 Encounter for screening for malignant neoplasm of colon: Secondary | ICD-10-CM

## 2017-01-16 DIAGNOSIS — D125 Benign neoplasm of sigmoid colon: Secondary | ICD-10-CM

## 2017-01-16 DIAGNOSIS — Z1212 Encounter for screening for malignant neoplasm of rectum: Secondary | ICD-10-CM | POA: Diagnosis not present

## 2017-01-16 MED ORDER — SODIUM CHLORIDE 0.9 % IV SOLN: 500.0000 mL | INTRAVENOUS | Status: DC

## 2017-01-16 NOTE — Patient Instructions (Addendum)
I found and removed one small polyp that looks benign.  I will let you know pathology results and when to have another routine colonoscopy by mail and/or My Chart.  You also have a condition called diverticulosis - common and not usually a problem. Very mild in you. Please read the handout provided.  I appreciate the opportunity to care for you. Gatha Mayer, MD, FACG  YOU HAD AN ENDOSCOPIC PROCEDURE TODAY AT [facility]:   Refer to the procedure report that was given to you for any specific questions about what was found during the examination.  If the procedure report does not answer your questions, please call your gastroenterologist to clarify.  If you requested that your care partner not be given the details of your procedure findings, then the procedure report has been included in a sealed envelope for you to review at your convenience later.  YOU SHOULD EXPECT: Some feelings of bloating in the abdomen. Passage of more gas than usual.  Walking can help get rid of the air that was put into your GI tract during the procedure and reduce the bloating. If you had a lower endoscopy (such as a colonoscopy or flexible sigmoidoscopy) you may notice spotting of blood in your stool or on the toilet paper. If you underwent a bowel prep for your procedure, you may not have a normal bowel movement for a few days.  Please Note:  You might notice some irritation and congestion in your nose or some drainage.  This is from the oxygen used during your procedure.  There is no need for concern and it should clear up in a day or so.  SYMPTOMS TO REPORT IMMEDIATELY:   Following lower endoscopy (colonoscopy or flexible sigmoidoscopy):  Excessive amounts of blood in the stool  Significant tenderness or worsening of abdominal pains  Swelling of the abdomen that is new, acute  Fever of 100F or higher  For urgent or emergent issues, a gastroenterologist can be reached at any hour by  calling [phone].   DIET:  We do recommend a small meal at first, but then you may proceed to your regular diet.  Drink plenty of fluids but you should avoid alcoholic beverages for 24 hours.  ACTIVITY:  You should plan to take it easy for the rest of today and you should NOT DRIVE or use heavy machinery until tomorrow (because of the sedation medicines used during the test).    FOLLOW UP: Our staff will call the number listed on your records the next business day following your procedure to check on you and address any questions or concerns that you may have regarding the information given to you following your procedure. If we do not reach you, we will leave a message.  However, if you are feeling well and you are not experiencing any problems, there is no need to return our call.  We will assume that you have returned to your regular daily activities without incident.  If any biopsies were taken you will be contacted by phone or by letter within the next 1-3 weeks.  Please call us at [phone] if you have not heard about the biopsies in 3 weeks.   Await for biopsy results to determine next repeat Colonoscopy Polyps (handout given) Diverticulosis (handout given)   SIGNATURES/CONFIDENTIALITY: You and/or your care partner have signed paperwork which will be entered into your electronic medical record.  These signatures attest to the fact that that the  information above on your After Visit Summary has been reviewed and is understood.  Full responsibility of the confidentiality of this discharge information lies with you and/or your care-partner.

## 2017-01-16 NOTE — Op Note (Signed)
[facility] Patient Name: Robin Mills Procedure Date: 01/16/2017 8:48 AM MRN: [id number] Endoscopist: Gatha Mayer , MD Age: 66 Referring MD:  Date of Birth: 21-Feb-1951 Gender: Female Account #: [id number] Procedure:                Colonoscopy Indications:              Screening for colorectal malignant neoplasm Medicines:                Propofol per Anesthesia, Monitored Anesthesia Care Procedure:                Pre-Anesthesia Assessment:                           - Prior to the procedure, a History and Physical                            was performed, and patient medications and                            allergies were reviewed. The patient's tolerance of                            previous anesthesia was also reviewed. The risks                            and benefits of the procedure and the sedation                            options and risks were discussed with the patient.                            All questions were answered, and informed consent                            was obtained. Prior Anticoagulants: The patient has                            taken no previous anticoagulant or antiplatelet                            agents. ASA Grade Assessment: II - A patient with                            mild systemic disease. After reviewing the risks                            and benefits, the patient was deemed in                            satisfactory condition to undergo the procedure.                           After obtaining informed consent, the colonoscope  was passed under direct vision. Throughout the                            procedure, the patient's blood pressure, pulse, and                            oxygen saturations were monitored continuously. The                            Colonoscope was introduced through the anus and                            advanced to the the cecum, identified by   appendiceal orifice and ileocecal valve. The                            patient tolerated the procedure well. The quality                            of the bowel preparation was excellent. The                            ileocecal valve, appendiceal orifice, and rectum                            were photographed. The colonoscopy was somewhat                            difficult due to significant looping. Successful                            completion of the procedure was aided by applying                            abdominal pressure. The bowel preparation used was                            Miralax. Scope In: 9:09:16 AM Scope Out: 9:23:49 AM Scope Withdrawal Time: 0 hours 11 minutes 1 second  Total Procedure Duration: 0 hours 14 minutes 33 seconds  Findings:                 The perianal and digital rectal examinations were                            normal.                           A 8 mm polyp was found in the sigmoid colon. The                            polyp was semi-pedunculated. The polyp was removed                            with a cold snare. Resection and retrieval were  complete.                           A few diverticula were found in the sigmoid colon.                           The exam was otherwise without abnormality on                            direct and retroflexion views. Complications:            No immediate complications. Estimated Blood Loss:     Estimated blood loss was minimal. Impression:               - One 8 mm polyp in the sigmoid colon, removed with                            a cold snare. Resected and retrieved.                           - Diverticulosis in the sigmoid colon.                           - The examination was otherwise normal on direct                            and retroflexion views. Recommendation:           - Patient has a contact number available for                            emergencies. The signs and  symptoms of potential                            delayed complications were discussed with the                            patient. Return to normal activities tomorrow.                            Written discharge instructions were provided to the                            patient.                           - Resume previous diet.                           - Continue present medications.                           - Await pathology results.                           - Repeat colonoscopy is recommended. The  colonoscopy date will be determined after pathology                            results from today's exam become available for                            review. Gatha Mayer, MD 01/16/2017 9:31:03 AM This report has been signed electronically.

## 2017-01-16 NOTE — Progress Notes (Signed)
Report given to PACU, vss 

## 2017-01-16 NOTE — Progress Notes (Signed)
Called to room to assist during endoscopic procedure.  Patient ID and intended procedure confirmed with present staff. Received instructions for my participation in the procedure from the performing physician.  

## 2017-01-17 ENCOUNTER — Telehealth: Payer: Self-pay | Admitting: *Deleted

## 2017-01-17 NOTE — Telephone Encounter (Signed)
Message left

## 2017-01-28 ENCOUNTER — Encounter: Payer: Self-pay | Admitting: Internal Medicine

## 2017-01-28 DIAGNOSIS — Z860101 Personal history of adenomatous and serrated colon polyps: Secondary | ICD-10-CM

## 2017-01-28 DIAGNOSIS — Z86010 Personal history of colonic polyps: Secondary | ICD-10-CM

## 2017-01-28 HISTORY — DX: Personal history of colonic polyps: Z86.010

## 2017-01-28 HISTORY — DX: Personal history of adenomatous and serrated colon polyps: Z86.0101

## 2017-01-28 NOTE — Progress Notes (Signed)
8 mm adenoma Recall 5 yrs 2023

## 2019-05-26 ENCOUNTER — Ambulatory Visit: Payer: Medicare PPO | Attending: Internal Medicine

## 2019-05-26 DIAGNOSIS — Z23 Encounter for immunization: Secondary | ICD-10-CM | POA: Insufficient documentation

## 2019-05-26 NOTE — Progress Notes (Signed)
   Covid-19 Vaccination Clinic  Name:  Robin Mills    MRN: [id number] DOB: 01-17-1951  05/26/2019  [name] was observed post Covid-19 immunization for 15 minutes without incidence. She was provided with Vaccine Information Sheet and instruction to access the V-Safe system.   [name] was instructed to call 911 with any severe reactions post vaccine: Marland Kitchen Difficulty breathing  . Swelling of your face and throat  . A fast heartbeat  . A bad rash all over your body  . Dizziness and weakness    Immunizations Administered    Name Date Dose VIS Date Route   Pfizer COVID-19 Vaccine 05/26/2019  2:12 PM 0.3 mL 04/11/2019 Intramuscular   Manufacturer: [place]   Lot: [id number]   [place]: [id number]

## 2019-06-16 ENCOUNTER — Ambulatory Visit: Payer: Medicare PPO | Attending: Internal Medicine

## 2019-06-16 DIAGNOSIS — Z23 Encounter for immunization: Secondary | ICD-10-CM | POA: Insufficient documentation

## 2019-06-16 NOTE — Progress Notes (Signed)
   Covid-19 Vaccination Clinic  Name:  Robin Mills    MRN: [id number] DOB: 08/05/1950  06/16/2019  Robin Mills was observed post Covid-19 immunization for 30 minutes based on pre-vaccination screening without incidence. She was provided with Vaccine Information Sheet and instruction to access the V-Safe system.   Robin Mills was instructed to call 911 with any severe reactions post vaccine: Marland Kitchen Difficulty breathing  . Swelling of your face and throat  . A fast heartbeat  . A bad rash all over your body  . Dizziness and weakness    Immunizations Administered    Name Date Dose VIS Date Route   Pfizer COVID-19 Vaccine 06/16/2019 12:29 PM 0.3 mL 04/11/2019 Intramuscular   Manufacturer: [place]   Lot: [id number]   [place]: [id number]

## 2020-01-01 ENCOUNTER — Encounter: Payer: Self-pay | Admitting: Neurology

## 2020-01-01 ENCOUNTER — Ambulatory Visit: Payer: Medicare PPO | Attending: Internal Medicine

## 2020-01-01 ENCOUNTER — Ambulatory Visit: Payer: Medicare PPO | Admitting: Neurology

## 2020-01-01 ENCOUNTER — Other Ambulatory Visit: Payer: Self-pay

## 2020-01-01 ENCOUNTER — Other Ambulatory Visit: Payer: Self-pay | Admitting: Obstetrics

## 2020-01-01 VITALS — BP 132/82 | HR 73 | Ht 68.0 in | Wt 220.0 lb

## 2020-01-01 DIAGNOSIS — G4733 Obstructive sleep apnea (adult) (pediatric): Secondary | ICD-10-CM

## 2020-01-01 DIAGNOSIS — E2839 Other primary ovarian failure: Secondary | ICD-10-CM

## 2020-01-01 DIAGNOSIS — R519 Headache, unspecified: Secondary | ICD-10-CM | POA: Diagnosis not present

## 2020-01-01 DIAGNOSIS — R351 Nocturia: Secondary | ICD-10-CM

## 2020-01-01 DIAGNOSIS — R5383 Other fatigue: Secondary | ICD-10-CM

## 2020-01-01 DIAGNOSIS — Z23 Encounter for immunization: Secondary | ICD-10-CM

## 2020-01-01 NOTE — Patient Instructions (Signed)
It was nice to see you again today.  Here is what we discussed today for your next steps:  1.  I will make a referral to [facility] for you to get a new CPAP machine.  We will request a pressure of 9 cm and nasal pillows as new mask and all new supplies.  [facility] will bill your insurance.   2.  I will make a referral for you to see Dr. Augustina Mood, dentist, in order to get evaluated and considered for a dental device as an alternative treatment to CPAP therapy, as a backup treatment for you.   3.  You will need to be compliant with your new CPAP machine and we will have to see you back within the first 90 days of starting new equipment, you can schedule with one of our nurse practitioners, we are offering virtual visits, as in video visits.

## 2020-01-01 NOTE — Progress Notes (Signed)
Subjective:    Patient ID: Robin Mills is a 69 y.o. female.  HPI     Robin Age, MD, PhD [facility] [address], [address] [place], [place] [address]  Dear Dr. Ardeth Mills,   I saw your patient, Robin Mills, upon your kind request, in my I reviewed your office note from 11/25/2019.  I was sleep clinic today for reevaluation of her obstructive sleep apnea.  The patient is unaccompanied today.  As you know, [name] is a 69 year old right-handed woman with an underlying medical history of hypertension, rosacea, obesity, reflux disease, anemia, anxiety, allergies, who was diagnosed with obstructive sleep apnea several years ago.  I met her once in July 2017 at which time we proceeded with a reevaluation with a split-night sleep study.  She had a split-night sleep study on 11/23/2015 which showed moderate obstructive sleep apnea with an AHI of 29.3/h, O2 nadir of 86%.  She was titrated to a optimal CPAP pressure of 9 cm.  Based on her test results I prescribed a CPAP treatment at a pressure of 9 cm.  She did not return for follow-up.  She did not actually start a new machine.  She reports that her insurance did not cover it.  She maintained using her old machine which she believes was about 67 years old.  She got supplies online.  She is currently not established with a local DME company.  She learned about the recall on the Respironics CPAP machine and has stopped using her CPAP about 2 months ago.  She reports issues with breathing at night, feeling like she is choking, nonrestorative sleep, tiredness, has had some morning headaches and nocturia about twice per average night, when she is on treatment and she indicates full compliance typically she has had great improvement in her sleep quality, no morning headaches and nocturia up to once per average night.  Her Epworth sleepiness score is 4 out of 24, fatigue severity score is 59 out of 63.  She retired about a  year ago.  She goes to bed around 1130 and rise time is around 8.  She registered her machine on November 12, 2019.  She has checked with her insurance, she would like to get a new machine.  She has purchased an over-the-counter bite guard or mouth appliance for sleep apnea treatment but it has not helped.  She has not been evaluated for an oral appliance with a dentist.  Weight has been fluctuating.  She had to stop taking phentermine because of side effects.    I reviewed your office note from 11/21/2019.    Previously:   11/03/15: 69 year old right-handed woman with an underlying medical history of hypertension, anxiety, rosacea, reflux disease, status post knee surgery, status post appendectomy in 2016, and obesity, who was diagnosed with obstructive sleep apnea and placed on CPAP therapy many years ago, last evaluation about 5-6 years ago. Her sleep study was conducted out of state, in [place] and results are not available for my review today. I reviewed CPAP compliance data from 10/04/2015 through 11/02/2015 which is a total of 30 days during which time she used her machine 29 days with percent used days greater than 4 hours at 96.7%, indicating excellent compliance with an average usage of 7 hours and 55 minutes. AHI information is not available, leak information is also not available on this download, her CPAP machine is an older model, Development worker, community Plus.  I reviewed your office note from  05/24/2015. [place]. She is divorced with 2 children (Daughter in [place] and Son is in [place]), she works as a Agricultural engineer.  She lives with her elderly mother and moved to be close to family and after her father died.  She has gained about 40 lb in the last 5 years.  Her original sleep apnea diagnosis goes back several years. She is currently on her second CPAP machine. She does have an older mask as well. She has been using this last mask for about 6 months now. She  needs an updated CPAP machine, updated diagnosis and updated supplies. In particular, in light of her weight gain which has been significant she is in need for reevaluation. She is working with her sister on her weight loss and has been more active physically in the last 10 days and more mindful about her caloric intake. She drinks caffeine in the form of coffee him about 2 cups per day, wine on weekends. She denies restless leg symptoms or leg twitching at night. She denies morning headaches. She has nocturia, about twice per night on average.  Her Past Medical History Is Significant For: Past Medical History:  Diagnosis Date  . Acute appendicitis with localized peritonitis 06/28/2014  . Allergy   . Anemia   . Anxiety   . GERD (gastroesophageal reflux disease)   . Hx of adenomatous polyp of colon 01/28/2017  . Hypertension   . Obesity   . Rosacea   . Sleep apnea    wears cpap     Her Past Surgical History Is Significant For: Past Surgical History:  Procedure Laterality Date  . ABDOMINAL HYSTERECTOMY    . APPENDECTOMY  06/2014  . cardaic cath     2010-normal  . COLONOSCOPY     [facility] - [facility] hyper[facility] polyp, 12/2016 8 mm adenoma  . KNEE SURGERY    . LAPAROSCOPIC APPENDECTOMY N/A 06/28/2014   Procedure: APPENDECTOMY LAPAROSCOPIC;  Surgeon: Excell Seltzer, MD;  Location: [facility];  Service: General;  Laterality: N/A;  . UMBILICAL HERNIA REPAIR N/A 06/28/2014   Procedure: HERNIA REPAIR UMBILICAL ADULT;  Surgeon: Excell Seltzer, MD;  Location: [facility];  Service: General;  Laterality: N/A;  . WISDOM TOOTH EXTRACTION     sodium pentathol with this and had a negative reaction     Her Family History Is Significant For: Family History  Problem Relation Mills of Onset  . Heart attack Mother   . Colon polyps Mother   . Alzheimer's disease Father   . Sleep apnea Father   . Heart attack Paternal Grandfather   . Sleep apnea Brother   . Cancer Brother   . Colon polyps Brother   .  Stroke Maternal Grandmother   . Colon polyps Sister   . Colon cancer Neg Hx   . Rectal cancer Neg Hx   . Stomach cancer Neg Hx   . Esophageal cancer Neg Hx     Her Social History Is Significant For: Social History   Socioeconomic History  . Marital status: Single    Spouse name: Not on file  . Number of children: 2  . Years of education: college  . Highest education level: Not on file  Occupational History  . Occupation: [employer]  Tobacco Use  . Smoking status: Never Smoker  . Smokeless tobacco: Never Used  Substance and Sexual Activity  . Alcohol use: Yes    Alcohol/week: 0.0 standard drinks  Comment: 3-4 per week  . Drug use: No  . Sexual activity: Not Currently  Other Topics Concern  . Not on file  Social History Narrative   Drinks 2 cups of coffee a day    Social Determinants of Health   Financial Resource Strain:   . Difficulty of Paying Living Expenses: Not on file  Food Insecurity:   . Worried About Charity fundraiser in the Last Year: Not on file  . Ran Out of Food in the Last Year: Not on file  Transportation Needs:   . Lack of Transportation (Medical): Not on file  . Lack of Transportation (Non-Medical): Not on file  Physical Activity:   . Days of Exercise per Week: Not on file  . Minutes of Exercise per Session: Not on file  Stress:   . Feeling of Stress : Not on file  Social Connections:   . Frequency of Communication with Friends and Family: Not on file  . Frequency of Social Gatherings with Friends and Family: Not on file  . Attends Religious Services: Not on file  . Active Member of Clubs or Organizations: Not on file  . Attends Archivist Meetings: Not on file  . Marital Status: Not on file    Her Allergies Are:  Allergies  Allergen Reactions  . Sulfa Antibiotics Hives  :   Her Current Medications Are:  Outpatient Encounter Medications as of 01/01/2020  Medication Sig  . aspirin 81 MG tablet Take 81 mg by  mouth 2 (two) times a week.  . cetirizine (ZYRTEC) 10 MG tablet Take 10 mg by mouth as needed for allergies.  Marland Kitchen losartan-hydrochlorothiazide (HYZAAR) 100-25 MG per tablet Take 1 tablet by mouth daily.   Marland Kitchen omeprazole (PRILOSEC) 40 MG capsule Take 40 mg by mouth daily.  Marland Kitchen venlafaxine XR (EFFEXOR-XR) 37.5 MG 24 hr capsule Take 37.5 mg by mouth daily with breakfast.   . [DISCONTINUED] Phentermine-Topiramate (QSYMIA) 3.75-23 MG CP24 Take 1 capsule by mouth daily. (Patient not taking: Reported on 01/01/2020)  . [DISCONTINUED] tetracycline (ACHROMYCIN,SUMYCIN) 500 MG capsule Take 500 mg by mouth daily.  (Patient not taking: Reported on 01/01/2020)   No facility-administered encounter medications on file as of 01/01/2020.  : Review of Systems:  Out of a complete 14 point review of systems, all are reviewed and negative with the exception of these symptoms as listed below: Review of Systems  Neurological:       Here for sleep consult. Pt's last sleep study was in 2017 and has been using a phillips machine since. Due to the recall pt stopped using. She refuses to use the machine and cannot go any longer without her cpap. She sts she is concerned for her health. Epworth Sleepiness Scale 0= would never doze 1= slight chance of dozing 2= moderate chance of dozing 3= high chance of dozing  Sitting and reading:0 Watching TV:0 Sitting inactive in a public place (ex. Theater or meeting):0 As a passenger in a car for an hour without a break:1 Lying down to rest in the afternoon:3 Sitting and talking to someone:0 Sitting quietly after lunch (no alcohol):0 In a car, while stopped in traffic:0 Total:4 Score based of cpap usage    Objective:  Neurological Exam  Physical Exam Physical Examination:   Vitals:   01/01/20 1300  BP: 132/82  Pulse: 73  SpO2: 98%    General Examination: The patient is a very pleasant 69 y.o. female in no acute distress. She appears well-developed and  well-nourished and  well groomed.   HEENT: Normocephalic, atraumatic, pupils are equal, round and reactive to light, extraocular tracking is preserved, face is symmetric, normal facial animation, hearing is grossly intact.  Airway examination reveals mild mouth dryness, tongue protrudes centrally and palate elevates symmetrically.  Moderate airway crowding noted.  Mild overbite.    Chest: Clear to auscultation without wheezing, rhonchi or crackles noted.  Heart: S1+S2+0, regular and normal without murmurs, rubs or gallops noted.   Abdomen: Soft, non-tender and non-distended.  Extremities: There is no edema.  Skin: Warm and dry without trophic changes noted. There are no varicose veins.  Musculoskeletal: exam reveals no obvious joint deformities, tenderness or joint swelling or erythema.   Neurologically:  Mental status: The patient is awake, alert and oriented in all 4 spheres. Her immediate and remote memory, attention, language skills and fund of knowledge are appropriate. There is no evidence of aphasia, agnosia, apraxia or anomia. Speech is clear with normal prosody and enunciation. Thought process is linear. Mood is normal and affect is normal.  Cranial nerves II - XII are as described above under HEENT exam. Motor exam: Normal bulk, strength and tone is noted. There is no tremor, fine motor skills and coordination: grossly intact.  Cerebellar testing: No dysmetria or intention tremor. There is no truncal or gait ataxia.  Sensory exam: intact to light touch.  Gait, station and balance: She stands easily. No veering to one side is noted. No leaning to one side is noted. Posture is Mills-appropriate and stance is narrow based. Gait shows normal stride length and normal pace. No problems turning are noted.    Assessment and Plan:   In summary, Robin Mills is a very pleasant 70 year old female with an underlying medical history of hypertension, rosacea, obesity, reflux disease, anemia, anxiety,  allergies, who was diagnosed with obstructive sleep apnea several years ago.  She had a split-night sleep study with [place] in 2017 but did not get a new machine at the time.  She estimates that her machine is about 67 years old and it is a Magazine features editor.  She reports that it has been recalled.  I am not sure if she has an actual DreamStation that has been recalled but she registered it online and she reports that it was affected by the recall.  Nevertheless, she should be eligible for a new machine.  I would like to prescribe a CPAP of 9 cm.  She has been using a nasal pillows interface, she would be interested in establishing with a local DME company.  In addition, I would like to refer her to Dr. Augustina Mood in dentistry for consideration of an oral appliance which could work as an alternative or backup for her especially since the recall has affected her.  She is agreeable to this referral.  She has benefited from CPAP therapy and has been compliant until she learned about the recall in mid July.  She is advised to follow-up for sleep apnea recheck and compliance recheck in about 3 months, she can see one of our nurse practitioners in a virtual visit.  She is advised to call us if she has any interim questions or concerns.  I placed an order for a CPAP machine and we will send the order to [facility].  I have placed a referral to dentistry for her to consult with Dr. Augustina Mood.  I answered all her questions today and she was in agreement.    Thank you very  much for allowing me to participate in the care of this nice patient. If I can be of any further assistance to you please do not hesitate to call me at [phone].  Sincerely,   Robin Age, MD, PhD

## 2020-01-01 NOTE — Progress Notes (Signed)
   Covid-19 Vaccination Clinic  Name:  Nita Whitmire    MRN: [id number] DOB: June 01, 1950  01/01/2020  Ms. Reaney was observed post Covid-19 immunization for 15 minutes without incident. She was provided with Vaccine Information Sheet and instruction to access the V-Safe system.   Ms. Milillo was instructed to call 911 with any severe reactions post vaccine: Marland Kitchen Difficulty breathing  . Swelling of face and throat  . A fast heartbeat  . A bad rash all over body  . Dizziness and weakness

## 2020-02-04 ENCOUNTER — Telehealth: Payer: Self-pay

## 2020-02-04 NOTE — Telephone Encounter (Signed)
Received this message from [facility].  This patient was called on 09/28 (no answer not able to LVM), 10/01 (no answer not able to LVM, text sent), 10/04 (no answer not able to LVM Text sent)  I am going to void this order out.

## 2020-04-07 ENCOUNTER — Encounter: Payer: Self-pay | Admitting: Family Medicine

## 2020-04-07 ENCOUNTER — Ambulatory Visit: Payer: Medicare PPO | Admitting: Family Medicine

## 2020-04-12 ENCOUNTER — Other Ambulatory Visit: Payer: Medicare PPO

## 2020-06-28 DIAGNOSIS — L719 Rosacea, unspecified: Secondary | ICD-10-CM | POA: Insufficient documentation

## 2020-06-28 DIAGNOSIS — Z90710 Acquired absence of both cervix and uterus: Secondary | ICD-10-CM | POA: Insufficient documentation

## 2020-12-17 ENCOUNTER — Other Ambulatory Visit: Payer: Self-pay

## 2020-12-17 ENCOUNTER — Emergency Department (HOSPITAL_BASED_OUTPATIENT_CLINIC_OR_DEPARTMENT_OTHER): Payer: Medicare PPO

## 2020-12-17 ENCOUNTER — Emergency Department (HOSPITAL_BASED_OUTPATIENT_CLINIC_OR_DEPARTMENT_OTHER): Payer: Medicare PPO | Admitting: Radiology

## 2020-12-17 ENCOUNTER — Encounter (HOSPITAL_BASED_OUTPATIENT_CLINIC_OR_DEPARTMENT_OTHER): Payer: Self-pay | Admitting: Emergency Medicine

## 2020-12-17 ENCOUNTER — Emergency Department (HOSPITAL_BASED_OUTPATIENT_CLINIC_OR_DEPARTMENT_OTHER)
Admission: EM | Admit: 2020-12-17 | Discharge: 2020-12-17 | Disposition: A | Discharge status: Home / Self Care | Payer: Medicare PPO | Attending: Emergency Medicine | Admitting: Emergency Medicine

## 2020-12-17 DIAGNOSIS — Z7982 Long term (current) use of aspirin: Secondary | ICD-10-CM | POA: Insufficient documentation

## 2020-12-17 DIAGNOSIS — E669 Obesity, unspecified: Secondary | ICD-10-CM

## 2020-12-17 DIAGNOSIS — R0602 Shortness of breath: Secondary | ICD-10-CM | POA: Diagnosis not present

## 2020-12-17 DIAGNOSIS — Z79899 Other long term (current) drug therapy: Secondary | ICD-10-CM | POA: Insufficient documentation

## 2020-12-17 DIAGNOSIS — I10 Essential (primary) hypertension: Secondary | ICD-10-CM | POA: Diagnosis not present

## 2020-12-17 DIAGNOSIS — M25572 Pain in left ankle and joints of left foot: Secondary | ICD-10-CM | POA: Insufficient documentation

## 2020-12-17 DIAGNOSIS — Z6834 Body mass index (BMI) 34.0-34.9, adult: Secondary | ICD-10-CM

## 2020-12-17 LAB — BASIC METABOLIC PANEL
Anion gap: 10 (ref 5–15)
BUN: 14 mg/dL (ref 8–23)
CO2: 30 mmol/L (ref 22–32)
Calcium: 9.5 mg/dL (ref 8.9–10.3)
Chloride: 101 mmol/L (ref 98–111)
Creatinine, Ser: 0.82 mg/dL (ref 0.44–1.00)
GFR, Estimated: >60 mL/min (ref >60)
Glucose, Bld: 96 mg/dL (ref 70–99)
Potassium: 3.6 mmol/L (ref 3.5–5.1)
Sodium: 141 mmol/L (ref 135–145)

## 2020-12-17 LAB — CBC
HCT: 36.6 % (ref 36.0–46.0)
Hemoglobin: 12.2 g/dL (ref 12.0–15.0)
MCH: 29.3 pg (ref 26.0–34.0)
MCHC: 33.3 g/dL (ref 30.0–36.0)
MCV: 88 fL (ref 80.0–100.0)
Platelets: 253 10*3/uL (ref 150–400)
RBC: 4.16 MIL/uL (ref 3.87–5.11)
RDW: 13 % (ref 11.5–15.5)
WBC: 10.6 10*3/uL — ABNORMAL HIGH (ref 4.0–10.5)
nRBC: 0 % (ref 0.0–0.2)

## 2020-12-17 LAB — D-DIMER, QUANTITATIVE: D-Dimer, Quant: 0.48 ug/mL-FEU (ref 0.00–0.50)

## 2020-12-17 LAB — TROPONIN I (HIGH SENSITIVITY): Troponin I (High Sensitivity): 4 ng/L (ref <18)

## 2020-12-17 NOTE — ED Provider Notes (Signed)
[facility] EMERGENCY DEPT Provider Note   CSN: [id number] Arrival date & time: 12/17/20  1240     History Chief Complaint  Patient presents with   Shortness of Breath    Robin Mills is a 70 y.o. female.  Pt presents to the ED today with sob.  The pt said she woke up from sleep in the night with sob and cp.  She felt like she was having a heart attack, but did not go to the ED.  She has not had any more of those episodes.  Pt has recently started walking for exercise and noticed some swelling to her left lateral ankle.  She is also sob when she walks, but is not sure if that is from being out of shape or not.      Past Medical History:  Diagnosis Date   Acute appendicitis with localized peritonitis 06/28/2014   Allergy    Anemia    Anxiety    GERD (gastroesophageal reflux disease)    Hx of adenomatous polyp of colon 01/28/2017   Hypertension    Obesity    Rosacea    Sleep apnea    wears cpap     Patient Active Problem List   Diagnosis Date Noted   Hx of adenomatous polyp of colon 01/28/2017    Past Surgical History:  Procedure Laterality Date   ABDOMINAL HYSTERECTOMY     APPENDECTOMY  06/2014   cardaic cath     2010-normal   [facility]OS[facility]     [facility], 12/2016 8 mm adenoma   KNEE SURGERY     LAPAROSCOPIC APPENDECTOMY N/A 06/28/2014   Procedure: APPENDECTOMY LAPAROSCOPIC;  Surgeon: Excell Seltzer, MD;  Location: [facility];  Service: General;  Laterality: N/A;   UMBILICAL HERNIA REPAIR N/A 06/28/2014   Procedure: HERNIA REPAIR UMBILICAL ADULT;  Surgeon: Excell Seltzer, MD;  Location: [facility];  Service: General;  Laterality: N/A;   WISDOM TOOTH EXTRACTION     sodium pentathol with this and had a negative reaction      OB History   No obstetric history on file.     Family History  Problem Relation Age of Onset   Heart attack Mother    Colon polyps Mother    Alzheimer's disease Father    Sleep apnea Father     Heart attack Paternal Grandfather    Sleep apnea Brother    Cancer Brother    Colon polyps Brother    Stroke Maternal Grandmother    Colon polyps Sister    Colon cancer Neg Hx    Rectal cancer Neg Hx    Stomach cancer Neg Hx    Esophageal cancer Neg Hx     Social History   Tobacco Use   Smoking status: Never   Smokeless tobacco: Never  Substance Use Topics   Alcohol use: Yes    Alcohol/week: 0.0 standard drinks    Comment: 3-4 per week   Drug use: No    Home Medications Prior to Admission medications   Medication Sig Start Date End Date Taking? Authorizing Provider  aspirin 81 MG tablet Take 81 mg by mouth 2 (two) times a week.    [provider]  cetirizine (ZYRTEC) 10 MG tablet Take 10 mg by mouth as needed for allergies.    [provider]  losartan-hydrochlorothiazide (HYZAAR) 100-25 MG per tablet Take 1 tablet by mouth daily.  05/18/14   [provider]  omeprazole (PRILOSEC) 40 MG capsule  Take 40 mg by mouth daily.    [provider]  venlafaxine XR (EFFEXOR-XR) 37.5 MG 24 hr capsule Take 37.5 mg by mouth daily with breakfast.  05/18/14   [provider]    Allergies    Sulfa antibiotics  Review of Systems   Review of Systems  Respiratory:  Positive for shortness of breath.   Cardiovascular:  Positive for chest pain and leg swelling.  All other systems reviewed and are negative.  Physical Exam Updated Vital Signs BP 135/70   Pulse 74   Temp 98.8 F (37.1 C) (Oral)   Resp 15   Ht 5' 7.5" (1.715 m)   Wt 101.4 kg   SpO2 97%   BMI 34.50 kg/m   Physical Exam Vitals and nursing note reviewed.  Constitutional:      Appearance: She is well-developed. She is obese.  HENT:     Head: Normocephalic and atraumatic.     Mouth/Throat:     Mouth: Mucous membranes are moist.     Pharynx: Oropharynx is clear.  Eyes:     Extraocular Movements: Extraocular movements intact.     Pupils: Pupils are equal, round, and  reactive to light.  Cardiovascular:     Rate and Rhythm: Normal rate and regular rhythm.  Pulmonary:     Effort: Pulmonary effort is normal.     Breath sounds: Normal breath sounds.  Abdominal:     General: Bowel sounds are normal.     Palpations: Abdomen is soft.  Musculoskeletal:        General: Normal range of motion.     Cervical back: Normal range of motion and neck supple.     Comments: ? Cyst to left lateral ankle.  Freely mobile and nontender.  Skin:    General: Skin is warm.     Capillary Refill: Capillary refill takes less than 2 seconds.  Neurological:     General: No focal deficit present.     Mental Status: She is alert and oriented to person, place, and time.  Psychiatric:        Mood and Affect: Mood normal.        Behavior: Behavior normal.    ED Results / Procedures / Treatments   Labs (all labs ordered are listed, but only abnormal results are displayed) Labs Reviewed  CBC - Abnormal; Notable for the following components:      Result Value   WBC 10.6 (*)    All other components within normal limits  BASIC METABOLIC PANEL  D-DIMER, QUANTITATIVE  TROPONIN I (HIGH SENSITIVITY)  TROPONIN I (HIGH SENSITIVITY)    EKG EKG Interpretation  Date/Time:  Friday December 17 2020 12:54:02 EDT Ventricular Rate:  82 PR Interval:  172 QRS Duration: 80 QT Interval:  388 QTC Calculation: 453 R Axis:   74 Text Interpretation: Normal sinus rhythm Nonspecific ST abnormality Abnormal ECG No old tracing to compare Confirmed by Isla Pence [phone]) on 12/17/2020 12:56:45 PM  Radiology DG Chest 2 View  Result Date: 12/17/2020 CLINICAL DATA:  Chest pressure, shortness of breath with exertion. EXAM: CHEST - 2 VIEW COMPARISON:  None. FINDINGS: Linear densities at the left lung base are suggestive for atelectasis or scarring. Otherwise, the lungs are clear. No pulmonary edema. Heart and mediastinum are within normal limits. Trachea is midline. No large pleural effusions. No  acute bone abnormality. IMPRESSION: No active cardiopulmonary disease. Electronically Signed   By: Markus Daft M.D.   On: 12/17/2020 13:34   DG  Ankle Complete Left  Result Date: 12/17/2020 CLINICAL DATA:  Ankle swelling EXAM: LEFT ANKLE COMPLETE - 3+ VIEW COMPARISON:  None. FINDINGS: There is no acute fracture or dislocation. Alignment is normal. The ankle mortise is symmetrically intact. The soft tissues are unremarkable. There is mild Achilles enthesopathy and inferior calcaneal spurring. IMPRESSION: No acute fracture or dislocation. Electronically Signed   By: Valetta Mole M.D.   On: 12/17/2020 14:52    Procedures Procedures   Medications Ordered in ED Medications - No data to display  ED Course  I have reviewed the triage vital signs and the nursing notes.  Pertinent labs & imaging results that were available during my care of the patient were reviewed by me and considered in my medical decision making (see chart for details).    MDM Rules/Calculators/A&P                           Pt has not had any sob or cp for about a week or 2.  Cardiac work up neg.  Pt is stable for d/c.  She is encouraged to continue exercising and eating a healthy diet.  She is to return if worse.  F/u with pcp. Final Clinical Impression(s) / ED Diagnoses Final diagnoses:  SOB (shortness of breath) on exertion  Class 1 obesity without serious comorbidity with body mass index (BMI) of 34.0 to 34.9 in adult, unspecified obesity type  Acute left ankle pain    Rx / DC Orders ED Discharge Orders     None        Isla Pence, MD 12/17/20 1504

## 2020-12-17 NOTE — ED Triage Notes (Signed)
Patient reports last week waking up from her sleep with some shortness of breath and central chest pressure. Earlier this week noticed some swelling to her left ankle- states she has started walking daily to try and get her weight down.

## 2020-12-17 NOTE — Discharge Instructions (Addendum)
Continue exercising and eating a healthy diet!  Follow up with orthopedics if your ankle continues to bother you.

## 2021-01-04 ENCOUNTER — Other Ambulatory Visit (HOSPITAL_COMMUNITY): Payer: Self-pay | Admitting: Family Medicine

## 2021-01-04 DIAGNOSIS — R0609 Other forms of dyspnea: Secondary | ICD-10-CM

## 2021-01-07 DIAGNOSIS — Z713 Dietary counseling and surveillance: Secondary | ICD-10-CM | POA: Insufficient documentation

## 2021-01-28 ENCOUNTER — Other Ambulatory Visit: Payer: Self-pay

## 2021-01-28 ENCOUNTER — Ambulatory Visit (HOSPITAL_COMMUNITY): Payer: Medicare PPO | Attending: Cardiology

## 2021-01-28 DIAGNOSIS — R0609 Other forms of dyspnea: Secondary | ICD-10-CM | POA: Diagnosis not present

## 2021-01-30 LAB — ECHOCARDIOGRAM COMPLETE
Area-P 1/2: 2.66 cm2
S' Lateral: 2 cm

## 2021-03-02 DIAGNOSIS — G4733 Obstructive sleep apnea (adult) (pediatric): Secondary | ICD-10-CM | POA: Diagnosis not present

## 2021-03-11 DIAGNOSIS — R519 Headache, unspecified: Secondary | ICD-10-CM | POA: Diagnosis not present

## 2021-03-11 DIAGNOSIS — R0981 Nasal congestion: Secondary | ICD-10-CM | POA: Diagnosis not present

## 2021-03-11 DIAGNOSIS — R051 Acute cough: Secondary | ICD-10-CM | POA: Diagnosis not present

## 2021-03-11 DIAGNOSIS — J069 Acute upper respiratory infection, unspecified: Secondary | ICD-10-CM | POA: Diagnosis not present

## 2021-03-11 DIAGNOSIS — J029 Acute pharyngitis, unspecified: Secondary | ICD-10-CM | POA: Diagnosis not present

## 2021-03-11 DIAGNOSIS — Z1152 Encounter for screening for COVID-19: Secondary | ICD-10-CM | POA: Diagnosis not present

## 2021-03-11 DIAGNOSIS — M25472 Effusion, left ankle: Secondary | ICD-10-CM | POA: Diagnosis not present

## 2021-03-11 DIAGNOSIS — I10 Essential (primary) hypertension: Secondary | ICD-10-CM | POA: Diagnosis not present

## 2021-06-28 DIAGNOSIS — Z6835 Body mass index (BMI) 35.0-35.9, adult: Secondary | ICD-10-CM | POA: Diagnosis not present

## 2021-06-28 DIAGNOSIS — Z713 Dietary counseling and surveillance: Secondary | ICD-10-CM | POA: Diagnosis not present

## 2021-06-28 DIAGNOSIS — I10 Essential (primary) hypertension: Secondary | ICD-10-CM | POA: Diagnosis not present

## 2021-07-15 DIAGNOSIS — I10 Essential (primary) hypertension: Secondary | ICD-10-CM | POA: Diagnosis not present

## 2021-07-15 DIAGNOSIS — R7989 Other specified abnormal findings of blood chemistry: Secondary | ICD-10-CM | POA: Diagnosis not present

## 2021-07-19 DIAGNOSIS — Z6835 Body mass index (BMI) 35.0-35.9, adult: Secondary | ICD-10-CM | POA: Diagnosis not present

## 2021-07-19 DIAGNOSIS — Z713 Dietary counseling and surveillance: Secondary | ICD-10-CM | POA: Diagnosis not present

## 2021-07-22 DIAGNOSIS — Z0000 Encounter for general adult medical examination without abnormal findings: Secondary | ICD-10-CM | POA: Diagnosis not present

## 2021-07-22 DIAGNOSIS — G4733 Obstructive sleep apnea (adult) (pediatric): Secondary | ICD-10-CM | POA: Diagnosis not present

## 2021-07-22 DIAGNOSIS — M25579 Pain in unspecified ankle and joints of unspecified foot: Secondary | ICD-10-CM | POA: Diagnosis not present

## 2021-07-22 DIAGNOSIS — K219 Gastro-esophageal reflux disease without esophagitis: Secondary | ICD-10-CM | POA: Diagnosis not present

## 2021-07-22 DIAGNOSIS — M25472 Effusion, left ankle: Secondary | ICD-10-CM | POA: Diagnosis not present

## 2021-07-22 DIAGNOSIS — Z23 Encounter for immunization: Secondary | ICD-10-CM | POA: Diagnosis not present

## 2021-07-22 DIAGNOSIS — Z1331 Encounter for screening for depression: Secondary | ICD-10-CM | POA: Diagnosis not present

## 2021-07-22 DIAGNOSIS — E669 Obesity, unspecified: Secondary | ICD-10-CM | POA: Diagnosis not present

## 2021-07-22 DIAGNOSIS — I10 Essential (primary) hypertension: Secondary | ICD-10-CM | POA: Diagnosis not present

## 2021-07-22 DIAGNOSIS — Z1339 Encounter for screening examination for other mental health and behavioral disorders: Secondary | ICD-10-CM | POA: Diagnosis not present

## 2021-07-28 DIAGNOSIS — M25571 Pain in right ankle and joints of right foot: Secondary | ICD-10-CM | POA: Diagnosis not present

## 2021-07-28 DIAGNOSIS — M7661 Achilles tendinitis, right leg: Secondary | ICD-10-CM | POA: Diagnosis not present

## 2021-07-28 DIAGNOSIS — M6701 Short Achilles tendon (acquired), right ankle: Secondary | ICD-10-CM | POA: Insufficient documentation

## 2021-07-28 DIAGNOSIS — M25572 Pain in left ankle and joints of left foot: Secondary | ICD-10-CM | POA: Diagnosis not present

## 2021-07-28 DIAGNOSIS — M25872 Other specified joint disorders, left ankle and foot: Secondary | ICD-10-CM | POA: Diagnosis not present

## 2021-08-05 DIAGNOSIS — M25872 Other specified joint disorders, left ankle and foot: Secondary | ICD-10-CM | POA: Diagnosis not present

## 2021-08-10 DIAGNOSIS — M7661 Achilles tendinitis, right leg: Secondary | ICD-10-CM | POA: Diagnosis not present

## 2021-08-10 DIAGNOSIS — M25872 Other specified joint disorders, left ankle and foot: Secondary | ICD-10-CM | POA: Diagnosis not present

## 2021-08-10 DIAGNOSIS — M6701 Short Achilles tendon (acquired), right ankle: Secondary | ICD-10-CM | POA: Diagnosis not present

## 2021-08-12 DIAGNOSIS — M25571 Pain in right ankle and joints of right foot: Secondary | ICD-10-CM | POA: Diagnosis not present

## 2021-08-15 ENCOUNTER — Other Ambulatory Visit (HOSPITAL_COMMUNITY): Payer: Self-pay | Admitting: Orthopedic Surgery

## 2021-09-07 ENCOUNTER — Encounter (HOSPITAL_BASED_OUTPATIENT_CLINIC_OR_DEPARTMENT_OTHER): Payer: Self-pay | Admitting: Orthopedic Surgery

## 2021-09-07 ENCOUNTER — Other Ambulatory Visit: Payer: Self-pay

## 2021-09-12 ENCOUNTER — Encounter (HOSPITAL_BASED_OUTPATIENT_CLINIC_OR_DEPARTMENT_OTHER)
Admission: RE | Admit: 2021-09-12 | Discharge: 2021-09-12 | Disposition: A | Discharge status: Home / Self Care | Payer: Medicare PPO | Source: Ambulatory Visit | Attending: Orthopedic Surgery | Admitting: Orthopedic Surgery

## 2021-09-12 DIAGNOSIS — Z01812 Encounter for preprocedural laboratory examination: Secondary | ICD-10-CM | POA: Insufficient documentation

## 2021-09-12 LAB — BASIC METABOLIC PANEL
Anion gap: 9 (ref 5–15)
BUN: 8 mg/dL (ref 8–23)
CO2: 28 mmol/L (ref 22–32)
Calcium: 9.2 mg/dL (ref 8.9–10.3)
Chloride: 102 mmol/L (ref 98–111)
Creatinine, Ser: 0.69 mg/dL (ref 0.44–1.00)
GFR, Estimated: >60 mL/min (ref >60)
Glucose, Bld: 115 mg/dL — ABNORMAL HIGH (ref 70–99)
Potassium: 3.6 mmol/L (ref 3.5–5.1)
Sodium: 139 mmol/L (ref 135–145)

## 2021-09-12 NOTE — Progress Notes (Signed)

## 2021-09-15 ENCOUNTER — Ambulatory Visit (HOSPITAL_BASED_OUTPATIENT_CLINIC_OR_DEPARTMENT_OTHER): Payer: Medicare PPO | Admitting: Anesthesiology

## 2021-09-15 ENCOUNTER — Ambulatory Visit (HOSPITAL_BASED_OUTPATIENT_CLINIC_OR_DEPARTMENT_OTHER)
Admission: RE | Admit: 2021-09-15 | Discharge: 2021-09-15 | Disposition: A | Discharge status: Home / Self Care | Payer: Medicare PPO | Attending: Orthopedic Surgery | Admitting: Orthopedic Surgery

## 2021-09-15 ENCOUNTER — Encounter (HOSPITAL_BASED_OUTPATIENT_CLINIC_OR_DEPARTMENT_OTHER): Payer: Self-pay | Admitting: Orthopedic Surgery

## 2021-09-15 ENCOUNTER — Other Ambulatory Visit: Payer: Self-pay

## 2021-09-15 ENCOUNTER — Encounter (HOSPITAL_BASED_OUTPATIENT_CLINIC_OR_DEPARTMENT_OTHER)
Admission: RE | Disposition: A | Discharge status: Home / Self Care | Payer: Self-pay | Source: Home / Self Care | Attending: Orthopedic Surgery

## 2021-09-15 DIAGNOSIS — Z8249 Family history of ischemic heart disease and other diseases of the circulatory system: Secondary | ICD-10-CM | POA: Diagnosis not present

## 2021-09-15 DIAGNOSIS — I10 Essential (primary) hypertension: Secondary | ICD-10-CM | POA: Diagnosis not present

## 2021-09-15 DIAGNOSIS — G4730 Sleep apnea, unspecified: Secondary | ICD-10-CM | POA: Insufficient documentation

## 2021-09-15 DIAGNOSIS — R9431 Abnormal electrocardiogram [ECG] [EKG]: Secondary | ICD-10-CM | POA: Insufficient documentation

## 2021-09-15 DIAGNOSIS — R2242 Localized swelling, mass and lump, left lower limb: Secondary | ICD-10-CM | POA: Diagnosis not present

## 2021-09-15 DIAGNOSIS — F419 Anxiety disorder, unspecified: Secondary | ICD-10-CM | POA: Insufficient documentation

## 2021-09-15 DIAGNOSIS — D1724 Benign lipomatous neoplasm of skin and subcutaneous tissue of left leg: Secondary | ICD-10-CM | POA: Insufficient documentation

## 2021-09-15 DIAGNOSIS — M25572 Pain in left ankle and joints of left foot: Secondary | ICD-10-CM | POA: Insufficient documentation

## 2021-09-15 DIAGNOSIS — I2510 Atherosclerotic heart disease of native coronary artery without angina pectoris: Secondary | ICD-10-CM | POA: Diagnosis not present

## 2021-09-15 DIAGNOSIS — Z86010 Personal history of colonic polyps: Secondary | ICD-10-CM

## 2021-09-15 DIAGNOSIS — K219 Gastro-esophageal reflux disease without esophagitis: Secondary | ICD-10-CM | POA: Insufficient documentation

## 2021-09-15 HISTORY — PX: LIPOMA EXCISION: SHX5283

## 2021-09-15 SURGERY — EXCISION LIPOMA
Anesthesia: General | Site: Ankle | Laterality: Left

## 2021-09-15 MED ORDER — PROPOFOL 500 MG/50ML IV EMUL
INTRAVENOUS | Status: AC
  Filled 2021-09-15: qty 100

## 2021-09-15 MED ORDER — ONDANSETRON HCL 4 MG/2ML IJ SOLN
INTRAMUSCULAR | Status: DC | PRN
  Administered 2021-09-15: 4 mg via INTRAVENOUS

## 2021-09-15 MED ORDER — FENTANYL CITRATE (PF) 100 MCG/2ML IJ SOLN
INTRAMUSCULAR | Status: DC | PRN
  Administered 2021-09-15: 25 ug via INTRAVENOUS
  Administered 2021-09-15: 50 ug via INTRAVENOUS
  Administered 2021-09-15: 25 ug via INTRAVENOUS

## 2021-09-15 MED ORDER — EPHEDRINE SULFATE (PRESSORS) 50 MG/ML IJ SOLN
INTRAMUSCULAR | Status: DC | PRN
  Administered 2021-09-15 (×2): 10 mg via INTRAVENOUS
  Administered 2021-09-15: 5 mg via INTRAVENOUS

## 2021-09-15 MED ORDER — LIDOCAINE 2% (20 MG/ML) 5 ML SYRINGE
INTRAMUSCULAR | Status: DC | PRN
Start: 2021-09-15 — End: 2021-09-15
  Administered 2021-09-15: 60 mg via INTRAVENOUS

## 2021-09-15 MED ORDER — OXYCODONE HCL 5 MG PO TABS: 5.0000 mg | ORAL_TABLET | Freq: Once | ORAL | Status: DC | PRN

## 2021-09-15 MED ORDER — VANCOMYCIN HCL 500 MG IV SOLR
INTRAVENOUS | Status: AC
  Filled 2021-09-15: qty 10

## 2021-09-15 MED ORDER — DEXAMETHASONE SODIUM PHOSPHATE 10 MG/ML IJ SOLN
INTRAMUSCULAR | Status: DC | PRN
  Administered 2021-09-15: 4 mg via INTRAVENOUS

## 2021-09-15 MED ORDER — BUPIVACAINE-EPINEPHRINE (PF) 0.5% -1:200000 IJ SOLN
INTRAMUSCULAR | Status: AC
  Filled 2021-09-15: qty 30

## 2021-09-15 MED ORDER — SODIUM CHLORIDE 0.9 % IV SOLN: INTRAVENOUS | Status: DC

## 2021-09-15 MED ORDER — ONDANSETRON HCL 4 MG/2ML IJ SOLN: 4.0000 mg | Freq: Four times a day (QID) | INTRAMUSCULAR | Status: DC | PRN

## 2021-09-15 MED ORDER — FENTANYL CITRATE (PF) 100 MCG/2ML IJ SOLN
INTRAMUSCULAR | Status: AC
Start: 2021-09-15 — End: ?
  Filled 2021-09-15: qty 2

## 2021-09-15 MED ORDER — LACTATED RINGERS IV SOLN: INTRAVENOUS | Status: DC

## 2021-09-15 MED ORDER — PROPOFOL 10 MG/ML IV BOLUS
INTRAVENOUS | Status: DC | PRN
  Administered 2021-09-15: 180 mg via INTRAVENOUS

## 2021-09-15 MED ORDER — MIDAZOLAM HCL 5 MG/5ML IJ SOLN
INTRAMUSCULAR | Status: DC | PRN
Start: 2021-09-15 — End: 2021-09-15
  Administered 2021-09-15: 2 mg via INTRAVENOUS

## 2021-09-15 MED ORDER — MIDAZOLAM HCL 2 MG/2ML IJ SOLN
INTRAMUSCULAR | Status: AC
Start: 2021-09-15 — End: ?
  Filled 2021-09-15: qty 2

## 2021-09-15 MED ORDER — DOCUSATE SODIUM 100 MG PO CAPS: 100.0000 mg | ORAL_CAPSULE | Freq: Two times a day (BID) | ORAL | 0 refills | Status: DC

## 2021-09-15 MED ORDER — VANCOMYCIN HCL 500 MG IV SOLR
INTRAVENOUS | Status: DC | PRN
  Administered 2021-09-15: 500 mg

## 2021-09-15 MED ORDER — SENNA 8.6 MG PO TABS: 2.0000 | ORAL_TABLET | Freq: Two times a day (BID) | ORAL | 0 refills | Status: DC

## 2021-09-15 MED ORDER — 0.9 % SODIUM CHLORIDE (POUR BTL) OPTIME
TOPICAL | Status: DC | PRN
  Administered 2021-09-15: 120 mL

## 2021-09-15 MED ORDER — CEFAZOLIN SODIUM-DEXTROSE 2-4 GM/100ML-% IV SOLN
INTRAVENOUS | Status: AC
  Filled 2021-09-15: qty 100

## 2021-09-15 MED ORDER — OXYCODONE HCL 5 MG PO TABS
5.0000 mg | ORAL_TABLET | Freq: Four times a day (QID) | ORAL | 0 refills | Status: AC | PRN
Start: 2021-09-15 — End: 2021-09-18

## 2021-09-15 MED ORDER — BUPIVACAINE-EPINEPHRINE 0.5% -1:200000 IJ SOLN
INTRAMUSCULAR | Status: DC | PRN
  Administered 2021-09-15: 10 mL

## 2021-09-15 MED ORDER — CEFAZOLIN SODIUM-DEXTROSE 2-4 GM/100ML-% IV SOLN
2.0000 g | INTRAVENOUS | Status: AC
Start: 2021-09-15 — End: 2021-09-15
  Administered 2021-09-15: 2 g via INTRAVENOUS

## 2021-09-15 MED ORDER — OXYCODONE HCL 5 MG/5ML PO SOLN: 5.0000 mg | Freq: Once | ORAL | Status: DC | PRN

## 2021-09-15 MED ORDER — FENTANYL CITRATE (PF) 100 MCG/2ML IJ SOLN: 25.0000 ug | INTRAMUSCULAR | Status: DC | PRN

## 2021-09-15 SURGICAL SUPPLY — 61 items
APL PRP STRL LF DISP 70% ISPRP (MISCELLANEOUS) ×1
BANDAGE ESMARK 6X9 LF (GAUZE/BANDAGES/DRESSINGS) IMPLANT
BLADE MINI RND TIP GREEN BEAV (BLADE) ×2 IMPLANT
BLADE SURG 15 STRL LF DISP TIS (BLADE) ×2 IMPLANT
BLADE SURG 15 STRL SS (BLADE) ×4
BNDG CMPR 9X4 STRL LF SNTH (GAUZE/BANDAGES/DRESSINGS) ×1
BNDG CMPR 9X6 STRL LF SNTH (GAUZE/BANDAGES/DRESSINGS)
BNDG COHESIVE 4X5 TAN ST LF (GAUZE/BANDAGES/DRESSINGS) ×2 IMPLANT
BNDG CONFORM 2 STRL LF (GAUZE/BANDAGES/DRESSINGS) IMPLANT
BNDG CONFORM 3 STRL LF (GAUZE/BANDAGES/DRESSINGS) ×2 IMPLANT
BNDG ELASTIC 4X5.8 VLCR STR LF (GAUZE/BANDAGES/DRESSINGS) ×1 IMPLANT
BNDG ESMARK 4X9 LF (GAUZE/BANDAGES/DRESSINGS) ×2 IMPLANT
BNDG ESMARK 6X9 LF (GAUZE/BANDAGES/DRESSINGS)
BOOT STEPPER DURA MED (SOFTGOODS) ×1 IMPLANT
CHLORAPREP W/TINT 26 (MISCELLANEOUS) ×2 IMPLANT
CORD BIPOLAR FORCEPS 12FT (ELECTRODE) ×1 IMPLANT
COVER BACK TABLE 60X90IN (DRAPES) ×2 IMPLANT
CUFF TOURN SGL QUICK 18X4 (TOURNIQUET CUFF) IMPLANT
DRAPE EXTREMITY T 121X128X90 (DISPOSABLE) ×2 IMPLANT
DRAPE SURG 17X23 STRL (DRAPES) ×2 IMPLANT
DRSG MEPITEL 4X7.2 (GAUZE/BANDAGES/DRESSINGS) ×2 IMPLANT
DRSG PAD ABDOMINAL 8X10 ST (GAUZE/BANDAGES/DRESSINGS) ×2 IMPLANT
ELECT REM PT RETURN 9FT ADLT (ELECTROSURGICAL) ×2
ELECTRODE REM PT RTRN 9FT ADLT (ELECTROSURGICAL) ×1 IMPLANT
GAUZE SPONGE 4X4 12PLY STRL (GAUZE/BANDAGES/DRESSINGS) ×4 IMPLANT
GLOVE BIO SURGEON STRL SZ8 (GLOVE) ×2 IMPLANT
GLOVE BIOGEL PI IND STRL 8 (GLOVE) ×2 IMPLANT
GLOVE BIOGEL PI INDICATOR 8 (GLOVE) ×2
GLOVE ECLIPSE 8.0 STRL XLNG CF (GLOVE) ×2 IMPLANT
GOWN STRL REUS W/ TWL LRG LVL3 (GOWN DISPOSABLE) ×1 IMPLANT
GOWN STRL REUS W/ TWL XL LVL3 (GOWN DISPOSABLE) ×2 IMPLANT
GOWN STRL REUS W/TWL LRG LVL3 (GOWN DISPOSABLE) ×2
GOWN STRL REUS W/TWL XL LVL3 (GOWN DISPOSABLE) ×4
NDL HYPO 25X1 1.5 SAFETY (NEEDLE) IMPLANT
NEEDLE HYPO 25X1 1.5 SAFETY (NEEDLE) ×2 IMPLANT
NS IRRIG 1000ML POUR BTL (IV SOLUTION) ×2 IMPLANT
PACK BASIN DAY SURGERY FS (CUSTOM PROCEDURE TRAY) ×2 IMPLANT
PAD CAST 4YDX4 CTTN HI CHSV (CAST SUPPLIES) ×1 IMPLANT
PADDING CAST ABS 4INX4YD NS (CAST SUPPLIES) ×1
PADDING CAST ABS COTTON 4X4 ST (CAST SUPPLIES) ×1 IMPLANT
PADDING CAST COTTON 4X4 STRL (CAST SUPPLIES) ×2
PENCIL SMOKE EVACUATOR (MISCELLANEOUS) ×1 IMPLANT
SANITIZER HAND PURELL 535ML FO (MISCELLANEOUS) ×2 IMPLANT
SHEET MEDIUM DRAPE 40X70 STRL (DRAPES) ×2 IMPLANT
SPONGE T-LAP 18X18 ~~LOC~~+RFID (SPONGE) ×2 IMPLANT
STOCKINETTE 4X48 STRL (DRAPES) IMPLANT
STOCKINETTE 6  STRL (DRAPES) ×2
STOCKINETTE 6 STRL (DRAPES) IMPLANT
STRIP CLOSURE SKIN 1/2X4 (GAUZE/BANDAGES/DRESSINGS) ×2 IMPLANT
SUCTION FRAZIER HANDLE 10FR (MISCELLANEOUS) ×1
SUCTION TUBE FRAZIER 10FR DISP (MISCELLANEOUS) IMPLANT
SUT ETHILON 3 0 PS 1 (SUTURE) ×1 IMPLANT
SUT ETHILON 4 0 PS 2 18 (SUTURE) ×1 IMPLANT
SUT MNCRL AB 3-0 PS2 18 (SUTURE) ×1 IMPLANT
SUT MNCRL AB 4-0 PS2 18 (SUTURE) ×1 IMPLANT
SYR BULB EAR ULCER 3OZ GRN STR (SYRINGE) ×2 IMPLANT
SYR CONTROL 10ML LL (SYRINGE) ×1 IMPLANT
TOWEL GREEN STERILE FF (TOWEL DISPOSABLE) ×2 IMPLANT
TUBE CONNECTING 20X1/4 (TUBING) ×1 IMPLANT
UNDERPAD 30X36 HEAVY ABSORB (UNDERPADS AND DIAPERS) ×2 IMPLANT
YANKAUER SUCT BULB TIP NO VENT (SUCTIONS) IMPLANT

## 2021-09-15 NOTE — Anesthesia Procedure Notes (Signed)
Procedure Name: LMA Insertion Date/Time: 09/15/2021 11:15 AM Performed by: Signe Colt, CRNA Pre-anesthesia Checklist: Patient identified, Emergency Drugs available, Suction available and Patient being monitored Patient Re-evaluated:Patient Re-evaluated prior to induction Oxygen Delivery Method: Circle System Utilized Preoxygenation: Pre-oxygenation with 100% oxygen Induction Type: IV induction Ventilation: Mask ventilation without difficulty LMA: LMA inserted LMA Size: 4.0 Number of attempts: 1 Airway Equipment and Method: bite block Placement Confirmation: positive ETCO2 Tube secured with: Tape Dental Injury: Teeth and Oropharynx as per pre-operative assessment

## 2021-09-15 NOTE — Transfer of Care (Signed)
Immediate Anesthesia Transfer of Care Note  Patient: Robin Mills  Procedure(s) Performed: EXCISION ANKLE  LIPOMA (Left: Ankle)  Patient Location: PACU  Anesthesia Type:General  Level of Consciousness: drowsy  Airway & Oxygen Therapy: Patient Spontanous Breathing and Patient connected to face mask oxygen  Post-op Assessment: Report given to RN and Post -op Vital signs reviewed and stable  Post vital signs: Reviewed and stable  Last Vitals:  Vitals Value Taken Time  BP 115/45 09/15/21 1150  Temp    Pulse 81 09/15/21 1152  Resp 14 09/15/21 1152  SpO2 97 % 09/15/21 1152  Vitals shown include unvalidated device data.  Last Pain:  Vitals:   09/15/21 0857  TempSrc: Oral  PainSc: 0-No pain      Patients Stated Pain Goal: 2 (03/52/48 1859)  Complications: No notable events documented.

## 2021-09-15 NOTE — Discharge Instructions (Addendum)
Robin Hewitt, MD [facility]  Please read the following information regarding your care after surgery.  Medications  You only need a prescription for the narcotic pain medicine (ex. oxycodone, Percocet, Norco).  All of the other medicines listed below are available over the counter. ? Aleve 2 pills twice a day for the first 3 days after surgery. ? acetominophen (Tylenol) 650 mg every 4-6 hours as you need for minor to moderate pain ? oxycodone as prescribed for severe pain  Narcotic pain medicine (ex. oxycodone, Percocet, Vicodin) will cause constipation.  To prevent this problem, take the following medicines while you are taking any pain medicine. ? docusate sodium (Colace) 100 mg twice a day ? senna (Senokot) 2 tablets twice a day  Weight Bearing ? Bear weight only on your operated foot in the CAM boot.   Cast / Splint / Dressing ? Keep your splint, cast or dressing clean and dry.  Don't put anything (coat hanger, pencil, etc) down inside of it.  If it gets damp, use a hair dryer on the cool setting to dry it.  If it gets soaked, call the office to schedule an appointment for a cast change.   After your dressing, cast or splint is removed; you may shower, but do not soak or scrub the wound.  Allow the water to run over it, and then gently pat it dry.  Swelling It is normal for you to have swelling where you had surgery.  To reduce swelling and pain, keep your toes above your nose for at least 3 days after surgery.  It may be necessary to keep your foot or leg elevated for several weeks.  If it hurts, it should be elevated.  Follow Up Call my office at [phone] when you are discharged from the hospital or surgery center to schedule an appointment to be seen two weeks after surgery.  Call my office at [phone] if you develop a fever >101.5 F, nausea, vomiting, bleeding from the surgical site or severe pain.        Post Anesthesia Home Care Instructions  Activity: Get  plenty of rest for the remainder of the day. A responsible individual must stay with you for 24 hours following the procedure.  For the next 24 hours, DO NOT: -Drive a car -Operate machinery -Drink alcoholic beverages -Take any medication unless instructed by your physician -Make any legal decisions or sign important papers.  Meals: Start with liquid foods such as gelatin or soup. Progress to regular foods as tolerated. Avoid greasy, spicy, heavy foods. If nausea and/or vomiting occur, drink only clear liquids until the nausea and/or vomiting subsides. Call your physician if vomiting continues.  Special Instructions/Symptoms: Your throat may feel dry or sore from the anesthesia or the breathing tube placed in your throat during surgery. If this causes discomfort, gargle with warm salt water. The discomfort should disappear within 24 hours.  If you had a scopolamine patch placed behind your ear for the management of post- operative nausea and/or vomiting:  1. The medication in the patch is effective for 72 hours, after which it should be removed.  Wrap patch in a tissue and discard in the trash. Wash hands thoroughly with soap and water. 2. You may remove the patch earlier than 72 hours if you experience unpleasant side effects which may include dry mouth, dizziness or visual disturbances. 3. Avoid touching the patch. Wash your hands with soap and water after contact with the patch.      

## 2021-09-15 NOTE — H&P (Signed)
Robin Mills is an 71 y.o. female.   Chief Complaint: Left ankle pain HPI: 71 year old female complains of pain at a prominent left posterolateral ankle mass.  An MRI reveals a lipoma.  She has failed nonoperative treatment including activity modification, oral anti-inflammatories and shoewear modification.  She presents today for excisional biopsy of this mass.  Past Medical History:  Diagnosis Date   Acute appendicitis with localized peritonitis 06/28/2014   Allergy    Anemia    Anxiety    GERD (gastroesophageal reflux disease)    Hx of adenomatous polyp of colon 01/28/2017   Hypertension    Obesity    Rosacea    Sleep apnea    wears cpap     Past Surgical History:  Procedure Laterality Date   ABDOMINAL HYSTERECTOMY     APPENDECTOMY  06/2014   cardaic cath     2010-normal   COLONOSCOPY     2007 [facility] - probable hyperplastic polyp, 12/2016 8 mm adenoma   KNEE SURGERY     LAPAROSCOPIC APPENDECTOMY N/A 06/28/2014   Procedure: APPENDECTOMY LAPAROSCOPIC;  Surgeon: Excell Seltzer, MD;  Location: [facility];  Service: General;  Laterality: N/A;   UMBILICAL HERNIA REPAIR N/A 06/28/2014   Procedure: HERNIA REPAIR UMBILICAL ADULT;  Surgeon: Excell Seltzer, MD;  Location: [facility];  Service: General;  Laterality: N/A;   WISDOM TOOTH EXTRACTION     sodium pentathol with this and had a negative reaction     Family History  Problem Relation Age of Onset   Heart attack Mother    Colon polyps Mother    Alzheimer's disease Father    Sleep apnea Father    Heart attack Paternal Grandfather    Sleep apnea Brother    Cancer Brother    Colon polyps Brother    Stroke Maternal Grandmother    Colon polyps Sister    Colon cancer Neg Hx    Rectal cancer Neg Hx    Stomach cancer Neg Hx    Esophageal cancer Neg Hx    Social History:  reports that she has never smoked. She has never used smokeless tobacco. She reports current alcohol use. She reports that she does not use drugs.  Allergies:   Allergies  Allergen Reactions   Sulfa Antibiotics Hives    Medications Prior to Admission  Medication Sig Dispense Refill   aspirin 81 MG tablet Take 81 mg by mouth 2 (two) times a week.     cetirizine (ZYRTEC) 10 MG tablet Take 10 mg by mouth as needed for allergies.     losartan-hydrochlorothiazide (HYZAAR) 100-25 MG per tablet Take 1 tablet by mouth daily.   7   omeprazole (PRILOSEC) 40 MG capsule Take 40 mg by mouth daily.     SEMAGLUTIDE, 1 MG/DOSE, [place] Inject into the skin once a week.     venlafaxine XR (EFFEXOR-XR) 37.5 MG 24 hr capsule Take 37.5 mg by mouth daily with breakfast.   3    No results found for this or any previous visit (from the past 48 hour(s)). No results found.  Review of Systems no recent fever, chills, nausea, vomiting or changes in her appetite  Blood pressure (!) 124/52, pulse 76, temperature 98 F (36.7 C), temperature source Oral, resp. rate 18, height '5\' 7"'$  (1.702 m), weight 100.6 kg, SpO2 97 %. Physical Exam  Well-nourished well-developed woman in no apparent distress.  Alert and oriented.  Extraocular motions are intact.  Respirations are unlabored.  Gait is normal.  The  left ankle has a subcutaneous mass between the peroneals and the Achilles tendon.  Sensibility to light touch in the sural nerve distribution is normal.  Skin is healthy and intact.  Pulses are palpable.  5 out of 5 strength in plantarflexion and eversion.   Assessment/Plan Painful left ankle mass -to the operating room today for excisional biopsy of this subcutaneous mass.The risks and benefits of the alternative treatment options have been discussed in detail.  The patient wishes to proceed with surgery and specifically understands risks of bleeding, infection, nerve damage, blood clots, need for additional surgery, amputation and death.   Wylene Simmer, MD 2021-09-30, 10:55 AM

## 2021-09-15 NOTE — Op Note (Signed)
09/15/2021  11:55 AM  PATIENT:  Robin Mills  71 y.o. female  PRE-OPERATIVE DIAGNOSIS:  left ankle mass  POST-OPERATIVE DIAGNOSIS:  left ankle mass  Procedure(s):  excision of left ankle mass 5 cm x 3 cm x 3 cm  SURGEON:  Wylene Simmer, MD  ASSISTANT: Mechele Claude, PA-C  ANESTHESIA:   General, local  EBL:  minimal   TOURNIQUET:   Total Tourniquet Time Documented: Thigh (Left) - 10 minutes Total: Thigh (Left) - 10 minutes  COMPLICATIONS:  None apparent  DISPOSITION:  Extubated, awake and stable to recovery.  INDICATION FOR PROCEDURE: 71 year old female with a prominent mass in the subcutaneous tissues between the left peroneals and Achilles tendon.  She has failed nonoperative treatment including activity modification, oral anti-inflammatories and shoewear modification.  An MRI indicates a subcutaneous lipoma.  She presents for surgical excision today.  The risks and benefits of the alternative treatment options have been discussed in detail.  The patient wishes to proceed with surgery and specifically understands risks of bleeding, infection, nerve damage, blood clots, need for additional surgery, amputation and death.   PROCEDURE IN DETAIL:After pre operative consent was obtained, and the correct operative site was identified, the patient was brought to the operating room and placed supine on the OR table.  Anesthesia was administered.  Pre-operative antibiotics were administered.  A surgical timeout was taken.  The left lower extremity was prepped and draped in standard sterile fashion with a tourniquet around the thigh.  The extremity was elevated and the tourniquet was inflated to 250 mmHg.  An incision was then made longitudinally over the mass.  Dissection was carried sharply down through the skin.  Subcutaneous tissues were then bluntly dissected around the mass.  It was not especially well-circumscribed but was clearly defined from the surrounding soft tissues and subcutaneous  fat.  Branches of the sural nerve were protected.  It was carefully dissected free from the surrounding soft tissues and removed.  Was passed off the field as a specimen to pathology.  The tourniquet was released.  Hemostasis was achieved.  The wound was irrigated copiously.  The wound was sprinkled with vancomycin powder.  Subcutaneous tissues were approximated with Monocryl.  The skin incision was closed with nylon.  Sterile dressings were applied followed by compression wrap and a cam boot.  The patient was awakened from anesthesia and transported to the recovery room in stable condition.   FOLLOW UP PLAN: Weightbearing as tolerated in the cam boot.  Follow-up in 2 weeks for suture removal and pathology results.  No indication for DVT prophylaxis in this ambulatory patient.    Mechele Claude PA-C was present and scrubbed for the duration of the operative case. His assistance was essential in positioning the patient, prepping and draping, gaining and maintaining exposure, performing the operation, closing and dressing the wounds and applying the splint.

## 2021-09-15 NOTE — Anesthesia Preprocedure Evaluation (Signed)
Anesthesia Evaluation  Patient identified by MRN, date of birth, ID band Patient awake    Reviewed: Allergy & Precautions, H&P , NPO status , Patient's Chart, lab work & pertinent test results  Airway Mallampati: II   Neck ROM: full    Dental   Pulmonary sleep apnea ,    breath sounds clear to auscultation       Cardiovascular hypertension,  Rhythm:regular Rate:Normal     Neuro/Psych PSYCHIATRIC DISORDERS Anxiety    GI/Hepatic GERD  ,  Endo/Other    Renal/GU      Musculoskeletal   Abdominal   Peds  Hematology   Anesthesia Other Findings   Reproductive/Obstetrics                             Anesthesia Physical Anesthesia Plan  ASA: 2  Anesthesia Plan: General   Post-op Pain Management:    Induction: Intravenous  PONV Risk Score and Plan: 3 and Ondansetron, Dexamethasone, Midazolam and Treatment may vary due to age or medical condition  Airway Management Planned: LMA  Additional Equipment:   Intra-op Plan:   Post-operative Plan: Extubation in OR  Informed Consent: I have reviewed the patients History and Physical, chart, labs and discussed the procedure including the risks, benefits and alternatives for the proposed anesthesia with the patient or authorized representative who has indicated his/her understanding and acceptance.     Dental advisory given  Plan Discussed with: CRNA, Anesthesiologist and Surgeon  Anesthesia Plan Comments:         Anesthesia Quick Evaluation

## 2021-09-16 ENCOUNTER — Encounter (HOSPITAL_BASED_OUTPATIENT_CLINIC_OR_DEPARTMENT_OTHER): Payer: Self-pay | Admitting: Orthopedic Surgery

## 2021-09-16 LAB — SURGICAL PATHOLOGY

## 2021-09-16 NOTE — Anesthesia Postprocedure Evaluation (Addendum)
Anesthesia Post Note  Patient: Robin Mills  Procedure(s) Performed: EXCISION ANKLE  LIPOMA (Left: Ankle)     Patient location during evaluation: PACU Anesthesia Type: General Level of consciousness: awake and alert Pain management: pain level controlled Vital Signs Assessment: post-procedure vital signs reviewed and stable Respiratory status: spontaneous breathing, nonlabored ventilation, respiratory function stable and patient connected to nasal cannula oxygen Cardiovascular status: blood pressure returned to baseline and stable Postop Assessment: no apparent nausea or vomiting Anesthetic complications: no   No notable events documented.  Last Vitals:  Vitals:   09/15/21 1228 09/15/21 1245  BP:  (!) 128/48  Pulse: 79 80  Resp: 17 14  Temp:  36.4 C  SpO2: 95% 95%    Last Pain:  Vitals:   09/15/21 1245  TempSrc:   PainSc: 0-No pain   Pain Goal: Patients Stated Pain Goal: 2 (09/15/21 0857)                 [place] S

## 2021-10-10 DIAGNOSIS — M25571 Pain in right ankle and joints of right foot: Secondary | ICD-10-CM | POA: Diagnosis not present

## 2021-10-14 DIAGNOSIS — L089 Local infection of the skin and subcutaneous tissue, unspecified: Secondary | ICD-10-CM | POA: Diagnosis not present

## 2021-10-14 DIAGNOSIS — W57XXXA Bitten or stung by nonvenomous insect and other nonvenomous arthropods, initial encounter: Secondary | ICD-10-CM | POA: Diagnosis not present

## 2021-10-14 DIAGNOSIS — T07XXXA Unspecified multiple injuries, initial encounter: Secondary | ICD-10-CM | POA: Diagnosis not present

## 2021-10-19 DIAGNOSIS — Z713 Dietary counseling and surveillance: Secondary | ICD-10-CM | POA: Diagnosis not present

## 2021-10-19 DIAGNOSIS — Z6834 Body mass index (BMI) 34.0-34.9, adult: Secondary | ICD-10-CM | POA: Diagnosis not present

## 2021-10-19 DIAGNOSIS — E663 Overweight: Secondary | ICD-10-CM | POA: Diagnosis not present

## 2021-11-25 DIAGNOSIS — H2513 Age-related nuclear cataract, bilateral: Secondary | ICD-10-CM | POA: Diagnosis not present

## 2022-01-09 DIAGNOSIS — Z713 Dietary counseling and surveillance: Secondary | ICD-10-CM | POA: Diagnosis not present

## 2022-01-09 DIAGNOSIS — E663 Overweight: Secondary | ICD-10-CM | POA: Diagnosis not present

## 2022-01-09 DIAGNOSIS — Z6833 Body mass index (BMI) 33.0-33.9, adult: Secondary | ICD-10-CM | POA: Diagnosis not present

## 2022-02-14 DIAGNOSIS — Z01411 Encounter for gynecological examination (general) (routine) with abnormal findings: Secondary | ICD-10-CM | POA: Diagnosis not present

## 2022-02-14 DIAGNOSIS — Z1231 Encounter for screening mammogram for malignant neoplasm of breast: Secondary | ICD-10-CM | POA: Diagnosis not present

## 2022-02-14 DIAGNOSIS — Z124 Encounter for screening for malignant neoplasm of cervix: Secondary | ICD-10-CM | POA: Diagnosis not present

## 2022-02-14 DIAGNOSIS — Z01419 Encounter for gynecological examination (general) (routine) without abnormal findings: Secondary | ICD-10-CM | POA: Diagnosis not present

## 2022-02-14 DIAGNOSIS — Z6834 Body mass index (BMI) 34.0-34.9, adult: Secondary | ICD-10-CM | POA: Diagnosis not present

## 2022-02-14 DIAGNOSIS — Z90711 Acquired absence of uterus with remaining cervical stump: Secondary | ICD-10-CM | POA: Diagnosis not present

## 2022-02-24 ENCOUNTER — Encounter: Payer: Self-pay | Admitting: Internal Medicine

## 2022-04-10 DIAGNOSIS — R059 Cough, unspecified: Secondary | ICD-10-CM | POA: Diagnosis not present

## 2022-04-10 DIAGNOSIS — G4733 Obstructive sleep apnea (adult) (pediatric): Secondary | ICD-10-CM | POA: Diagnosis not present

## 2022-04-10 DIAGNOSIS — R0981 Nasal congestion: Secondary | ICD-10-CM | POA: Diagnosis not present

## 2022-04-10 DIAGNOSIS — R5383 Other fatigue: Secondary | ICD-10-CM | POA: Diagnosis not present

## 2022-04-10 DIAGNOSIS — J209 Acute bronchitis, unspecified: Secondary | ICD-10-CM | POA: Diagnosis not present

## 2022-04-10 DIAGNOSIS — Z1152 Encounter for screening for COVID-19: Secondary | ICD-10-CM | POA: Diagnosis not present

## 2022-04-18 DIAGNOSIS — Z1152 Encounter for screening for COVID-19: Secondary | ICD-10-CM | POA: Diagnosis not present

## 2022-04-18 DIAGNOSIS — R5383 Other fatigue: Secondary | ICD-10-CM | POA: Diagnosis not present

## 2022-04-18 DIAGNOSIS — R059 Cough, unspecified: Secondary | ICD-10-CM | POA: Diagnosis not present

## 2022-04-18 DIAGNOSIS — J209 Acute bronchitis, unspecified: Secondary | ICD-10-CM | POA: Diagnosis not present

## 2022-04-18 DIAGNOSIS — R0981 Nasal congestion: Secondary | ICD-10-CM | POA: Diagnosis not present

## 2022-05-09 DIAGNOSIS — Z713 Dietary counseling and surveillance: Secondary | ICD-10-CM | POA: Diagnosis not present

## 2022-05-09 DIAGNOSIS — Z6834 Body mass index (BMI) 34.0-34.9, adult: Secondary | ICD-10-CM | POA: Diagnosis not present

## 2022-08-08 DIAGNOSIS — Z6834 Body mass index (BMI) 34.0-34.9, adult: Secondary | ICD-10-CM | POA: Diagnosis not present

## 2022-08-08 DIAGNOSIS — K219 Gastro-esophageal reflux disease without esophagitis: Secondary | ICD-10-CM | POA: Diagnosis not present

## 2022-08-08 DIAGNOSIS — R7989 Other specified abnormal findings of blood chemistry: Secondary | ICD-10-CM | POA: Diagnosis not present

## 2022-08-08 DIAGNOSIS — Z780 Asymptomatic menopausal state: Secondary | ICD-10-CM | POA: Diagnosis not present

## 2022-08-08 DIAGNOSIS — Z713 Dietary counseling and surveillance: Secondary | ICD-10-CM | POA: Diagnosis not present

## 2022-08-08 DIAGNOSIS — I10 Essential (primary) hypertension: Secondary | ICD-10-CM | POA: Diagnosis not present

## 2022-08-10 DIAGNOSIS — G4733 Obstructive sleep apnea (adult) (pediatric): Secondary | ICD-10-CM | POA: Diagnosis not present

## 2022-08-15 DIAGNOSIS — K921 Melena: Secondary | ICD-10-CM | POA: Diagnosis not present

## 2022-08-15 DIAGNOSIS — G4733 Obstructive sleep apnea (adult) (pediatric): Secondary | ICD-10-CM | POA: Diagnosis not present

## 2022-08-15 DIAGNOSIS — E669 Obesity, unspecified: Secondary | ICD-10-CM | POA: Diagnosis not present

## 2022-08-15 DIAGNOSIS — Z23 Encounter for immunization: Secondary | ICD-10-CM | POA: Diagnosis not present

## 2022-08-15 DIAGNOSIS — Z1339 Encounter for screening examination for other mental health and behavioral disorders: Secondary | ICD-10-CM | POA: Diagnosis not present

## 2022-08-15 DIAGNOSIS — Z0000 Encounter for general adult medical examination without abnormal findings: Secondary | ICD-10-CM | POA: Diagnosis not present

## 2022-08-15 DIAGNOSIS — I10 Essential (primary) hypertension: Secondary | ICD-10-CM | POA: Diagnosis not present

## 2022-08-15 DIAGNOSIS — Z1331 Encounter for screening for depression: Secondary | ICD-10-CM | POA: Diagnosis not present

## 2022-08-15 DIAGNOSIS — M25559 Pain in unspecified hip: Secondary | ICD-10-CM | POA: Diagnosis not present

## 2022-08-15 DIAGNOSIS — K219 Gastro-esophageal reflux disease without esophagitis: Secondary | ICD-10-CM | POA: Diagnosis not present

## 2022-08-21 DIAGNOSIS — I10 Essential (primary) hypertension: Secondary | ICD-10-CM | POA: Diagnosis not present

## 2022-08-21 DIAGNOSIS — R82998 Other abnormal findings in urine: Secondary | ICD-10-CM | POA: Diagnosis not present

## 2022-08-31 DIAGNOSIS — M1611 Unilateral primary osteoarthritis, right hip: Secondary | ICD-10-CM | POA: Diagnosis not present

## 2022-08-31 DIAGNOSIS — M7061 Trochanteric bursitis, right hip: Secondary | ICD-10-CM | POA: Diagnosis not present

## 2022-08-31 DIAGNOSIS — M25551 Pain in right hip: Secondary | ICD-10-CM | POA: Insufficient documentation

## 2022-08-31 DIAGNOSIS — M7062 Trochanteric bursitis, left hip: Secondary | ICD-10-CM | POA: Diagnosis not present

## 2022-08-31 DIAGNOSIS — M1612 Unilateral primary osteoarthritis, left hip: Secondary | ICD-10-CM | POA: Diagnosis not present

## 2022-09-08 DIAGNOSIS — Z6832 Body mass index (BMI) 32.0-32.9, adult: Secondary | ICD-10-CM | POA: Diagnosis not present

## 2022-09-08 DIAGNOSIS — Z713 Dietary counseling and surveillance: Secondary | ICD-10-CM | POA: Diagnosis not present

## 2022-09-29 DIAGNOSIS — I951 Orthostatic hypotension: Secondary | ICD-10-CM | POA: Diagnosis not present

## 2022-09-29 DIAGNOSIS — R42 Dizziness and giddiness: Secondary | ICD-10-CM | POA: Diagnosis not present

## 2022-09-29 DIAGNOSIS — I10 Essential (primary) hypertension: Secondary | ICD-10-CM | POA: Diagnosis not present

## 2022-10-02 DIAGNOSIS — M7061 Trochanteric bursitis, right hip: Secondary | ICD-10-CM | POA: Diagnosis not present

## 2022-10-02 DIAGNOSIS — M7062 Trochanteric bursitis, left hip: Secondary | ICD-10-CM | POA: Diagnosis not present

## 2022-10-16 ENCOUNTER — Encounter: Payer: Self-pay | Admitting: Nurse Practitioner

## 2022-10-16 ENCOUNTER — Ambulatory Visit: Payer: Medicare PPO | Admitting: Nurse Practitioner

## 2022-10-16 VITALS — BP 130/70 | HR 87 | Ht 67.0 in | Wt 197.0 lb

## 2022-10-16 DIAGNOSIS — K625 Hemorrhage of anus and rectum: Secondary | ICD-10-CM | POA: Diagnosis not present

## 2022-10-16 DIAGNOSIS — C499 Malignant neoplasm of connective and soft tissue, unspecified: Secondary | ICD-10-CM | POA: Insufficient documentation

## 2022-10-16 DIAGNOSIS — Z86010 Personal history of colonic polyps: Secondary | ICD-10-CM

## 2022-10-16 NOTE — Patient Instructions (Addendum)
_______________________________________________________  If your blood pressure at your visit was 140/90 or greater, please contact your primary care physician to follow up on this. _______________________________________________________  If you are age 70 or older, your body mass index should be between 23-30. Your Body mass index is 30.85 kg/m. If this is out of the aforementioned range listed, please consider follow up with your Primary Care Provider. ________________________________________________________  The [facility] providers would like to encourage you to use [facility] to communicate with providers for non-urgent requests or questions.  Due to long hold times on the telephone, sending your provider a message by [facility] may be a faster and more efficient way to get a response.  Please allow 48 business hours for a response.  Please remember that this is for non-urgent requests.  _______________________________________________________   Thing to help with constipation: - Drink 60 ounces of water daily  - Eat foods high in fiber. Examples include whole wheat products (especially wheat bran), quinoa, brown rice, legumes, leafy greens like kale, almonds, walnuts, seeds, and fruits with edible skins like pears and apples. - Start two fibercon capsules a day. They may take a couple of weeks to work. Stop if causes you to have excessive bloating.  - Take 1-2 stool softeners ( Docusate Sodium) at bedtime if stools are hard - DO NOT STRAIN to have a bowel movement. You can use glycerin suppositories as needed to make it easier to pass stool.  - During a bowel movement, sit straight up ( do no lean over your knees). If able, elevate feet 6-8 inches on a stool when having a bowel movement.   You have been scheduled for a colonoscopy. Please follow written instructions given to you at your visit today.  Please pick up your prep supplies at the pharmacy within the next 1-3 days. If you use inhalers  (even only as needed), please bring them with you on the day of your procedure.  Due to recent changes in healthcare laws, you may see the results of your imaging and laboratory studies on MyChart before your provider has had a chance to review them.  We understand that in some cases there may be results that are confusing or concerning to you. Not all laboratory results come back in the same time frame and the provider may be waiting for multiple results in order to interpret others.  Please give [place] 48 hours in order for your provider to thoroughly review all the results before contacting the office for clarification of your results.   Thank you for entrusting me with your care and choosing [facility].  Willette Cluster, NP

## 2022-10-16 NOTE — Progress Notes (Signed)
Assessment / Plan   Primary GI: Stan Head, MD  72 year old female with a one  year history of intermittent painless rectal bleeding with bowel movements.  Bleeding probably secondary to internal hemorrhoids.  However bleeding from colon polyps or neoplasm is not excluded -She was given a 5-year recall colonoscopy in 2018 .  Based on the updated guidelines this could be changed to a 7-year recall.  However, in light of the rectal bleeding, her personal history and family history of colon polyps will proceed earlier with a colonoscopy will proceed. The risks and benefits of colonoscopy with possible polypectomy / biopsies were discussed and the patient agrees to proceed.    Chronically altered bowel habits. Stool consistency varies between normal, loose and hard.  - See AVS for basic information on water intake, diet, sitting position on toilet - Fibercon, 2 capsules daily  - Take 1-2 stool softeners ( Docusate Sodium) at bedtime with a full glass of water - DO NOT STRAIN to have a bowel movement. Can use glycerin suppositories as needed to make it easier to pass stool.   Orthostatic hypotension, newly diagnosed. EKG at PCP's office a couple of weeks ago was reportedly normal. PCP is having her monitor her BP at home. For the time being she remains on BP meds. -I will send a message to her PCP to make sure he has no concerns about her undergoing sedation/colonoscopy  Sleep OSA, on CPAP.    History of Present Illness   Chief Complaint: Patient referred by PCP for blood in stool  72 y.o. yo female with a past medical history consisting of, but not necessarily limited to HTN, colon polyps  Jovee was last seen in 2018 at time of colonoscopy. She has been having intermittent painless rectal bleeding with bowel movements for about a year. She has chronically altered bowel habits. Stools sometimes hard, sometimes loose and sometimes normal in consistency. On average she does have at  least 3 BMs a week. Started Wegogy a year ago and so far has not had any GI side effects. She will be seeng [facility] soon.   Patient has a family history of colon polyps in brother and sister. Her sister who is 3 years younger than he requires frequent surveillance colonoscopies.    Her brother passed away with Leiomyosarcoma  Grasyn has recently had some episodes of orthostatic hypotension.  She is being monitored by her PCP.  For now she is being kept on her blood pressure medications and is just monitoring her blood pressures at home   Previous Endoscopies / Labs /  Imaging   Sept 2018 screening colonoscopy - One 8 mm polyp in the sigmoid colon, removed with a cold snare. Resected and retrieved. - Diverticulosis in the sigmoid colon. - The examination was otherwise normal on direct and retroflexion views. Path-tubular adenoma.       Latest Ref Rng & Units 12/17/2020    1:50 PM 06/29/2014    5:28 AM 06/28/2014    3:42 PM  CBC  WBC 4.0 - 10.5 K/uL 10.6  11.7  14.1   Hemoglobin 12.0 - 15.0 g/dL 16.1  09.6  04.5   Hematocrit 36.0 - 46.0 % 36.6  35.5  36.6   Platelets 150 - 400 K/uL 253  250  266     Lab Results  Component Value Date   LIPASE 15 06/28/2014      Latest Ref Rng & Units 09/12/2021  3:30 PM 12/17/2020    1:50 PM 06/29/2014    5:28 AM  CMP  Glucose 70 - 99 mg/dL 213  96  086   BUN 8 - 23 mg/dL 8  14  10    Creatinine 0.44 - 1.00 mg/dL 5.78  4.69  6.29   Sodium 135 - 145 mmol/L 139  141  138   Potassium 3.5 - 5.1 mmol/L 3.6  3.6  3.7   Chloride 98 - 111 mmol/L 102  101  103   CO2 22 - 32 mmol/L 28  30  29    Calcium 8.9 - 10.3 mg/dL 9.2  9.5  8.5     ECHOCARDIOGRAM COMPLETE    ECHOCARDIOGRAM REPORT       Patient Name:   LENDA MORTENSEN Date of Exam: 01/28/2021 Medical Rec #:  [id number]       Height:       67.5 in Accession #:    [id number]      Weight:       223.5 lb Date of Birth:  02-26-1951        BSA:          2.132 m Patient Age:    70  years        BP:           132/82 mmHg Patient Gender: F               HR:           67 bpm. Exam Location:  [address]  Procedure: 2D Echo, Cardiac Doppler and Color Doppler  Indications:    R06.00 Dyspnea   History:        Patient has no prior history of Echocardiogram examinations.                 Signs/Symptoms:Shortness of Breath. Obstructive sleep apnea.                 Obesity.   Sonographer:    Cathie Beams RCS Referring Phys: [id number] LINDSAY S LEE  IMPRESSIONS   1. Left ventricular ejection fraction, by estimation, is 65 to 70%. The left ventricle has normal function. The left ventricle has no regional wall motion abnormalities. There is mild left ventricular hypertrophy. Left ventricular diastolic parameters  were normal.  2. Right ventricular systolic function is normal. The right ventricular size is normal. Tricuspid regurgitation signal is inadequate for assessing PA pressure.  3. The mitral valve is normal in structure. Trivial mitral valve regurgitation. No evidence of mitral stenosis.  4. The aortic valve is tricuspid. Aortic valve regurgitation is not visualized. No aortic stenosis is present.  FINDINGS  Left Ventricle: Left ventricular ejection fraction, by estimation, is 65 to 70%. The left ventricle has normal function. The left ventricle has no regional wall motion abnormalities. The left ventricular internal cavity size was normal in size. There is  mild left ventricular hypertrophy. Left ventricular diastolic parameters were normal.  Right Ventricle: The right ventricular size is normal. No increase in right ventricular wall thickness. Right ventricular systolic function is normal. Tricuspid regurgitation signal is inadequate for assessing PA pressure.  Left Atrium: Left atrial size was normal in size.  Right Atrium: Right atrial size was normal in size.  Pericardium: There is no evidence of pericardial effusion.  Mitral Valve: The mitral valve is normal  in structure. Trivial mitral valve regurgitation. No evidence of mitral valve stenosis.  Tricuspid Valve: The tricuspid valve is normal in structure. Tricuspid  valve regurgitation is not demonstrated.  Aortic Valve: The aortic valve is tricuspid. Aortic valve regurgitation is not visualized. No aortic stenosis is present.  Pulmonic Valve: The pulmonic valve was not well visualized. Pulmonic valve regurgitation is mild.  Aorta: The aortic root and ascending aorta are structurally normal, with no evidence of dilitation.  IAS/Shunts: The interatrial septum was not well visualized.    LEFT VENTRICLE PLAX 2D LVIDd:         3.70 cm  Diastology LVIDs:         2.00 cm  LV e' medial:    6.85 cm/s LV PW:         1.20 cm  LV E/e' medial:  10.2 LV IVS:        0.90 cm  LV e' lateral:   9.46 cm/s LVOT diam:     1.95 cm  LV E/e' lateral: 7.4 LV SV:         68 LV SV Index:   32 LVOT Area:     2.99 cm    RIGHT VENTRICLE RV Basal diam:  1.60 cm RV S prime:     11.60 cm/s TAPSE (M-mode): 2.1 cm  LEFT ATRIUM             Index       RIGHT ATRIUM          Index LA diam:        3.40 cm 1.59 cm/m  RA Area:     8.81 cm LA Vol (A2C):   47.9 ml 22.47 ml/m RA Volume:   14.10 ml 6.61 ml/m LA Vol (A4C):   46.7 ml 21.90 ml/m LA Biplane Vol: 47.6 ml 22.33 ml/m  AORTIC VALVE LVOT Vmax:   110.00 cm/s LVOT Vmean:  64.100 cm/s LVOT VTI:    0.227 m   AORTA Ao Root diam: 2.80 cm  MITRAL VALVE MV Area (PHT): 2.66 cm    SHUNTS MV Decel Time: 285 msec    Systemic VTI:  0.23 m MV E velocity: 70.20 cm/s  Systemic Diam: 1.95 cm MV A velocity: 84.40 cm/s MV E/A ratio:  0.83  Epifanio Lesches MD Electronically signed by Epifanio Lesches MD Signature Date/Time: 01/30/2021/2:20:13 PM      Final    Past Surgical History:  Procedure Laterality Date   ABDOMINAL HYSTERECTOMY     APPENDECTOMY  06/2014   cardaic cath     2010-normal   COLONOSCOPY     [facility] - probable hyperplastic polyp,  12/2016 8 mm adenoma   KNEE SURGERY     LAPAROSCOPIC APPENDECTOMY N/A 06/28/2014   Procedure: APPENDECTOMY LAPAROSCOPIC;  Surgeon: Glenna Fellows, MD;  Location: [facility];  Service: General;  Laterality: N/A;   LIPOMA EXCISION Left 09/15/2021   Procedure: EXCISION ANKLE  LIPOMA;  Surgeon: Toni Arthurs, MD;  Location: [facility];  Service: Orthopedics;  Laterality: Left;  regional block   UMBILICAL HERNIA REPAIR N/A 06/28/2014   Procedure: HERNIA REPAIR UMBILICAL ADULT;  Surgeon: Glenna Fellows, MD;  Location: [facility];  Service: General;  Laterality: N/A;   WISDOM TOOTH EXTRACTION     sodium pentathol with this and had a negative reaction      Past Medical History:  Diagnosis Date   Acute appendicitis with localized peritonitis 06/28/2014   Allergy    Anemia    Anxiety    GERD (gastroesophageal reflux disease)    Hx of adenomatous polyp of colon 01/28/2017   Hypertension    Obesity  Rosacea    Sleep apnea    wears cpap    Past Surgical History:  Procedure Laterality Date   ABDOMINAL HYSTERECTOMY     APPENDECTOMY  06/2014   cardaic cath     2010-normal   COLONOSCOPY     [facility] hyperplastic polyp, 12/2016 8 mm adenoma   KNEE SURGERY     LAPAROSCOPIC APPENDECTOMY N/A 06/28/2014   Procedure: APPENDECTOMY LAPAROSCOPIC;  Surgeon: Glenna Fellows, MD;  Location: [facility];  Service: General;  Laterality: N/A;   LIPOMA EXCISION Left 09/15/2021   Procedure: EXCISION ANKLE  LIPOMA;  Surgeon: Toni Arthurs, MD;  Location: [facility];  Service: Orthopedics;  Laterality: Left;  regional block   UMBILICAL HERNIA REPAIR N/A 06/28/2014   Procedure: HERNIA REPAIR UMBILICAL ADULT;  Surgeon: Glenna Fellows, MD;  Location: [facility];  Service: General;  Laterality: N/A;   WISDOM TOOTH EXTRACTION     sodium pentathol with this and had a negative reaction    Family History  Problem Relation Age of Onset   Heart attack Mother    Colon polyps Mother     Alzheimer's disease Father    Sleep apnea Father    Heart attack Paternal Grandfather    Sleep apnea Brother    Cancer Brother    Colon polyps Brother    Stroke Maternal Grandmother    Colon polyps Sister    Colon cancer Neg Hx    Rectal cancer Neg Hx    Stomach cancer Neg Hx    Esophageal cancer Neg Hx    Social History   Tobacco Use   Smoking status: Never   Smokeless tobacco: Never  Vaping Use   Vaping Use: Never used  Substance Use Topics   Alcohol use: Yes    Alcohol/week: 0.0 standard drinks of alcohol    Comment: 3-4 per week   Drug use: No   Current Outpatient Medications  Medication Sig Dispense Refill   losartan-hydrochlorothiazide (HYZAAR) 100-25 MG per tablet Take 1 tablet by mouth daily.   7   omeprazole (PRILOSEC) 40 MG capsule Take 40 mg by mouth daily.     SEMAGLUTIDE, 1 MG/DOSE, Ada Inject into the skin once a week.     Semaglutide-Weight Management (WEGOVY) 0.25 MG/0.5ML SOAJ Inject 0.25 mg into the skin.     venlafaxine XR (EFFEXOR-XR) 37.5 MG 24 hr capsule Take 37.5 mg by mouth daily with breakfast.   3   aspirin 81 MG tablet Take 81 mg by mouth 2 (two) times a week. (Patient not taking: Reported on 10/16/2022)     cetirizine (ZYRTEC) 10 MG tablet Take 10 mg by mouth as needed for allergies. (Patient not taking: Reported on 10/16/2022)     docusate sodium (COLACE) 100 MG capsule Take 1 capsule (100 mg total) by mouth 2 (two) times daily. While taking narcotic pain medicine. (Patient not taking: Reported on 10/16/2022) 30 capsule 0   senna (SENOKOT) 8.6 MG TABS tablet Take 2 tablets (17.2 mg total) by mouth 2 (two) times daily. (Patient not taking: Reported on 10/16/2022) 30 tablet 0   No current facility-administered medications for this visit.   Allergies  Allergen Reactions   Sulfa Antibiotics Hives     Review of Systems: All systems reviewed and negative except where noted in HPI.   Wt Readings from Last 3 Encounters:  10/16/22 197 lb (89.4 kg)   09/15/21 221 lb 12.5 oz (100.6 kg)  12/17/20 223 lb 8.7 oz (101.4 kg)  Physical Exam:  BP 130/70   Pulse 87   Ht 5\' 7"  (1.702 m)   Wt 197 lb (89.4 kg)   BMI 30.85 kg/m  Constitutional:  Pleasant, generally well appearing female in no acute distress. Psychiatric:  Normal mood and affect. Behavior is normal. EENT: Pupils normal.  Conjunctivae are normal. No scleral icterus. Neck supple.  Cardiovascular: Normal rate, regular rhythm.  Pulmonary/chest: Effort normal and breath sounds normal. No wheezing, rales or rhonchi. Abdominal: Soft, nondistended, nontender. Bowel sounds active throughout. There are no masses palpable. No hepatomegaly. Neurological: Alert and oriented to person place and time.  Willette Cluster, NP  10/16/2022, 2:13 PM  Cc:  Referring Provider Alysia Penna, MD

## 2022-10-18 ENCOUNTER — Ambulatory Visit (INDEPENDENT_AMBULATORY_CARE_PROVIDER_SITE_OTHER): Payer: Medicare PPO | Admitting: Family Medicine

## 2022-10-18 ENCOUNTER — Encounter (INDEPENDENT_AMBULATORY_CARE_PROVIDER_SITE_OTHER): Payer: Self-pay | Admitting: Family Medicine

## 2022-10-18 VITALS — BP 137/83 | HR 96 | Temp 97.9°F | Ht 67.0 in | Wt 197.0 lb

## 2022-10-18 DIAGNOSIS — E669 Obesity, unspecified: Secondary | ICD-10-CM | POA: Diagnosis not present

## 2022-10-18 DIAGNOSIS — Z0289 Encounter for other administrative examinations: Secondary | ICD-10-CM

## 2022-10-18 DIAGNOSIS — R635 Abnormal weight gain: Secondary | ICD-10-CM | POA: Insufficient documentation

## 2022-10-18 DIAGNOSIS — I10 Essential (primary) hypertension: Secondary | ICD-10-CM | POA: Diagnosis not present

## 2022-10-18 DIAGNOSIS — E66811 Obesity, class 1: Secondary | ICD-10-CM | POA: Insufficient documentation

## 2022-10-18 DIAGNOSIS — Z6830 Body mass index (BMI) 30.0-30.9, adult: Secondary | ICD-10-CM

## 2022-10-18 MED ORDER — WEGOVY 2.4 MG/0.75ML ~~LOC~~ SOAJ: 2.4000 mg | SUBCUTANEOUS | 0 refills | Status: DC

## 2022-10-18 NOTE — Progress Notes (Signed)
Office: [phone]  /  Fax: [phone]   Initial Visit  Robin Mills was seen in clinic today to evaluate for obesity. She is interested in losing weight to improve overall health and reduce the risk of weight related complications. She presents today to review program treatment options, initial physical assessment, and evaluation.     She was referred by: PCP  When asked what else they would like to accomplish? 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  She also had to pause walking for exercise due to growth on her L foot and achilles tendonitis on the R.  She is now able to walk more but has bursitis in both hips.  She saw ortho and had both injected.  NSAIDs and injections have helped.  She would like to 30 lb.    When asked how has your weight affected you? She states: Contributed to medical problems and Having fatigue  Some associated conditions: Hypertension and GERD  Contributing factors: Stress, Life event, and Menopause  Weight promoting medications identified: None  Current nutrition plan: None  Current level of physical activity: Walking  Current or previous pharmacotherapy: GLP-1 and Phentermine She is currently lost 20 lb on Wegovy 2.4 mg   Response to medication: Lost weight initially but was unable to sustain weight loss   Past medical history includes:   Past Medical History:  Diagnosis Date   Acute appendicitis with localized peritonitis 06/28/2014   Allergy    Anemia    Anxiety    GERD (gastroesophageal reflux disease)    Hx of adenomatous polyp of colon 01/28/2017   Hypertension    Obesity    Rosacea    Sleep apnea    wears cpap      Objective:   BP 137/83   Pulse 96   Temp 97.9 F (36.6 C)   Ht 5\' 7"  (1.702 m)   Wt 197 lb (89.4 kg)   SpO2 90%    BMI 30.85 kg/m  She was weighed on the bioimpedance scale: Body mass index is 30.85 kg/m.  Peak Weight:220 , Body Fat%:41.7, Visceral Fat Rating:12, Weight trend over the last 12 months: Unchanged  General:  Alert, oriented and cooperative. Patient is in no acute distress.  Respiratory: Normal respiratory effort, no problems with respiration noted   Gait: able to ambulate independently  Mental Status: Normal mood and affect. Normal behavior. Normal judgment and thought content.   DIAGNOSTIC DATA REVIEWED:  BMET    Component Value Date/Time   NA 139 09/12/2021 1530   K 3.6 09/12/2021 1530   CL 102 09/12/2021 1530   CO2 28 09/12/2021 1530   GLUCOSE 115 (H) 09/12/2021 1530   BUN 8 09/12/2021 1530   CREATININE 0.69 09/12/2021 1530   CREATININE 0.66 06/28/2014 1310   CALCIUM 9.2 09/12/2021 1530   GFRNONAA >60 09/12/2021 1530   GFRNONAA >89 06/28/2014 1310   GFRAA >90 06/29/2014 0528   GFRAA >89 06/28/2014 1310   No results found for: "HGBA1C" No results found for: "INSULIN" CBC    Component Value Date/Time   WBC 10.6 (H) 12/17/2020 1350   RBC 4.16 12/17/2020 1350   HGB 12.2 12/17/2020 1350   HCT 36.6 12/17/2020 1350   PLT 253 12/17/2020 1350   MCV 88.0 12/17/2020 1350   MCV 90.3 06/28/2014 1326   MCH  29.3 12/17/2020 1350   MCHC 33.3 12/17/2020 1350   RDW 13.0 12/17/2020 1350   Iron/TIBC/Ferritin/ %Sat No results found for: "IRON", "TIBC", "FERRITIN", "IRONPCTSAT" Lipid Panel  No results found for: "CHOL", "TRIG", "HDL", "CHOLHDL", "VLDL", "LDLCALC", "LDLDIRECT" Hepatic Function Panel     Component Value Date/Time   PROT 7.1 06/28/2014 1542   ALBUMIN 4.1 06/28/2014 1542   AST 23 06/28/2014 1542   ALT 37 (H) 06/28/2014 1542   ALKPHOS 65 06/28/2014 1542   BILITOT 0.6 06/28/2014 1542   No results found for: "TSH"   Assessment and Plan:   There are no diagnoses linked to this encounter.      Obesity Treatment / Action Plan:  Patient will work on garnering  support from family and friends to begin weight loss journey. Will work on eliminating or reducing the presence of highly palatable, calorie dense foods in the home. Will complete provided nutritional and psychosocial assessment questionnaire before the next appointment. Will be scheduled for indirect calorimetry to determine resting energy expenditure in a fasting state.  This will allow [place] to create a reduced calorie, high-protein meal plan to promote loss of fat mass while preserving muscle mass. Will think about ideas on how to incorporate physical activity into their daily routine. Counseled on the health benefits of losing 5%-15% of total body weight. Was counseled on nutritional approaches to weight loss and benefits of reducing processed foods and consuming plant-based foods and high quality protein as part of nutritional weight management. Was counseled on pharmacotherapy and role as an adjunct in weight management.   Obesity Education Performed Today:  She was weighed on the bioimpedance scale and results were discussed and documented in the synopsis.  We discussed obesity as a disease and the importance of a more detailed evaluation of all the factors contributing to the disease.  We discussed the importance of long term lifestyle changes which include nutrition, exercise and behavioral modifications as well as the importance of customizing this to her specific health and social needs.  We discussed the benefits of reaching a healthier weight to alleviate the symptoms of existing conditions and reduce the risks of the biomechanical, metabolic and psychological effects of obesity.  Robin Mills appears to be in the action stage of change and states they are ready to start intensive lifestyle modifications and behavioral modifications.  30 minutes was spent today on this visit including the above counseling, pre-visit chart review, and post-visit documentation.  Reviewed by  clinician on day of visit: allergies, medications, problem list, medical history, surgical history, family history, social history, and previous encounter notes pertinent to obesity diagnosis.    Seymour Bars, D.O. DABFM, DABOM [facility] [phone] [place], [place] [address] [phone]

## 2022-10-18 NOTE — Assessment & Plan Note (Signed)
Reviewed patient's weight history, program information and personal goals She has had some success on Wegovy 2.4 mg and is tolerating well but has hit a stall in weight loss.  Return for fasting labs, IC.  EKG is UTD.

## 2022-10-18 NOTE — Assessment & Plan Note (Signed)
Weight is up ~30 lb from baseline Weight increased through the years with stress of divorce, hysterectomy, inactivity following ortho surgery.  She is actively working on weight reduction. Denies big appetite, over eating or over snacking.  Denies big cravings.  Doing little exercise now  Pt likely has a slow metabolic rate due to these findings and is likely under- eating, esp protein.  She has room for more regular physical activity. Will r/o biologlical causes for weight gain with labs and check metabolic rate next visit.

## 2022-11-08 ENCOUNTER — Encounter: Payer: Self-pay | Admitting: Internal Medicine

## 2022-11-08 ENCOUNTER — Ambulatory Visit: Payer: Medicare PPO | Admitting: Internal Medicine

## 2022-11-08 VITALS — BP 130/65 | HR 80 | Temp 98.2°F | Resp 14 | Ht 67.0 in | Wt 197.0 lb

## 2022-11-08 DIAGNOSIS — Z86010 Personal history of colonic polyps: Secondary | ICD-10-CM | POA: Diagnosis not present

## 2022-11-08 DIAGNOSIS — K625 Hemorrhage of anus and rectum: Secondary | ICD-10-CM

## 2022-11-08 MED ORDER — SODIUM CHLORIDE 0.9 % IV SOLN: 500.0000 mL | Freq: Once | INTRAVENOUS | Status: DC

## 2022-11-08 NOTE — Patient Instructions (Addendum)
No polyps or cancer seen. Hemorrhoids were source of bleeding.  You still have diverticulosis - thickened muscle rings and pouches in the colon wall. Please read the handout about this condition.  I appreciate the opportunity to care for you. Iva Boop, MD, [facility]   Discharge instructions given. Handouts on Diverticulosis and Hemorrhoids. Resume previous medications. YOU HAD AN ENDOSCOPIC PROCEDURE TODAY AT THE [facility]:   Refer to the procedure report that was given to you for any specific questions about what was found during the examination.  If the procedure report does not answer your questions, please call your gastroenterologist to clarify.  If you requested that your care partner not be given the details of your procedure findings, then the procedure report has been included in a sealed envelope for you to review at your convenience later.  YOU SHOULD EXPECT: Some feelings of bloating in the abdomen. Passage of more gas than usual.  Walking can help get rid of the air that was put into your GI tract during the procedure and reduce the bloating. If you had a lower endoscopy (such as a colonoscopy or flexible sigmoidoscopy) you may notice spotting of blood in your stool or on the toilet paper. If you underwent a bowel prep for your procedure, you may not have a normal bowel movement for a few days.  Please Note:  You might notice some irritation and congestion in your nose or some drainage.  This is from the oxygen used during your procedure.  There is no need for concern and it should clear up in a day or so.  SYMPTOMS TO REPORT IMMEDIATELY:  Following lower endoscopy (colonoscopy or flexible sigmoidoscopy):  Excessive amounts of blood in the stool  Significant tenderness or worsening of abdominal pains  Swelling of the abdomen that is new, acute  Fever of 100F or higher   For urgent or emergent issues, a gastroenterologist can be reached at any hour by calling  [phone]. Do not use MyChart messaging for urgent concerns.    DIET:  We do recommend a small meal at first, but then you may proceed to your regular diet.  Drink plenty of fluids but you should avoid alcoholic beverages for 24 hours.  ACTIVITY:  You should plan to take it easy for the rest of today and you should NOT DRIVE or use heavy machinery until tomorrow (because of the sedation medicines used during the test).    FOLLOW UP: Our staff will call the number listed on your records the next business day following your procedure.  We will call around 7:15- 8:00 am to check on you and address any questions or concerns that you may have regarding the information given to you following your procedure. If we do not reach you, we will leave a message.     If any biopsies were taken you will be contacted by phone or by letter within the next 1-3 weeks.  Please call us at [phone] if you have not heard about the biopsies in 3 weeks.    SIGNATURES/CONFIDENTIALITY: You and/or your care partner have signed paperwork which will be entered into your electronic medical record.  These signatures attest to the fact that that the information above on your After Visit Summary has been reviewed and is understood.  Full responsibility of the confidentiality of this discharge information lies with you and/or your care-partner.

## 2022-11-08 NOTE — Progress Notes (Signed)
History and Physical Interval Note:  11/08/2022 9:27 AM  Robin Mills  has presented today for endoscopic procedure(s), with the diagnosis of  Encounter Diagnoses  Name Primary?   Rectal bleeding Yes   Hx of adenomatous polyp of colon   .  The various methods of evaluation and treatment have been discussed with the patient and/or family. After consideration of risks, benefits and other options for treatment, the patient has consented to  the endoscopic procedure(s).   The patient's history has been reviewed, patient examined, no change in status, stable for endoscopic procedure(s).  I have reviewed the patient's chart and labs.  Questions were answered to the patient's satisfaction.     Iva Boop, MD, [facility]

## 2022-11-08 NOTE — Op Note (Signed)
[facility] Patient Name: Robin Mills Procedure Date: 11/08/2022 8:57 AM MRN: [id number] Endoscopist: Iva Boop , MD, [id number] Age: 72 Referring MD:  Date of Birth: 31-Aug-1950 Gender: Female Account #: [id number] Procedure:                Colonoscopy Indications:              Rectal bleeding Medicines:                Monitored Anesthesia Care Procedure:                Pre-Anesthesia Assessment:                           - Prior to the procedure, a History and Physical                            was performed, and patient medications and                            allergies were reviewed. The patient's tolerance of                            previous anesthesia was also reviewed. The risks                            and benefits of the procedure and the sedation                            options and risks were discussed with the patient.                            All questions were answered, and informed consent                            was obtained. Prior Anticoagulants: The patient has                            taken no anticoagulant or antiplatelet agents. ASA                            Grade Assessment: II - A patient with mild systemic                            disease. After reviewing the risks and benefits,                            the patient was deemed in satisfactory condition to                            undergo the procedure.                           After obtaining informed consent, the colonoscope  was passed under direct vision. Throughout the                            procedure, the patient's blood pressure, pulse, and                            oxygen saturations were monitored continuously. The                            Olympus Scope [id number] was introduced through the                            anus and advanced to the the cecum, identified by                            appendiceal orifice and ileocecal  valve. The                            colonoscopy was performed without difficulty. The                            patient tolerated the procedure well. The quality                            of the bowel preparation was good. The ileocecal                            valve, appendiceal orifice, and rectum were                            photographed. The bowel preparation used was                            Miralax via split dose instruction. Scope In: 9:08:27 AM Scope Out: 9:19:02 AM Scope Withdrawal Time: 0 hours 7 minutes 37 seconds  Total Procedure Duration: 0 hours 10 minutes 35 seconds  Findings:                 The perianal and digital rectal examinations were                            normal.                           Internal hemorrhoids were found.                           Multiple diverticula were found in the sigmoid                            colon, descending colon and transverse colon.                           The exam was otherwise without abnormality on  direct and retroflexion views. Complications:            No immediate complications. Estimated Blood Loss:     Estimated blood loss: none. Impression:               - Internal hemorrhoids.                           - Diverticulosis in the sigmoid colon, in the                            descending colon and in the transverse colon. Most                            in sigmoid w/ some colonic narrowing                           - The examination was otherwise normal on direct                            and retroflexion views.                           - No specimens collected.                           - Personal history of colonic polyp - 8 mm adenoma                            2018. Recommendation:           - Patient has a contact number available for                            emergencies. The signs and symptoms of potential                            delayed complications were discussed  with the                            patient. Return to normal activities tomorrow.                            Written discharge instructions were provided to the                            patient.                           - Resume previous diet.                           - Continue present medications.                           - No repeat colonoscopy due to current age (59  years or older) and the absence of colonic polyps. Iva Boop, MD 11/08/2022 9:26:49 AM This report has been signed electronically.

## 2022-11-08 NOTE — Progress Notes (Signed)
To pacu, VSS. Report to [email or web address] 

## 2022-11-09 ENCOUNTER — Telehealth: Payer: Self-pay | Admitting: *Deleted

## 2022-11-09 NOTE — Telephone Encounter (Signed)
  Follow up Call-     11/08/2022    8:18 AM  Call back number  Post procedure Call Back phone  # [phone]  Permission to leave phone message Yes    Post procedure follow up phone call. No answer at number given.  Left message on voicemail.

## 2022-11-15 ENCOUNTER — Ambulatory Visit (INDEPENDENT_AMBULATORY_CARE_PROVIDER_SITE_OTHER): Payer: Medicare PPO | Admitting: Family Medicine

## 2022-11-15 ENCOUNTER — Encounter (INDEPENDENT_AMBULATORY_CARE_PROVIDER_SITE_OTHER): Payer: Self-pay | Admitting: Family Medicine

## 2022-11-15 VITALS — BP 118/76 | HR 70 | Temp 97.9°F | Ht 67.0 in | Wt 199.0 lb

## 2022-11-15 DIAGNOSIS — G4733 Obstructive sleep apnea (adult) (pediatric): Secondary | ICD-10-CM

## 2022-11-15 DIAGNOSIS — R0602 Shortness of breath: Secondary | ICD-10-CM | POA: Diagnosis not present

## 2022-11-15 DIAGNOSIS — Z6831 Body mass index (BMI) 31.0-31.9, adult: Secondary | ICD-10-CM | POA: Diagnosis not present

## 2022-11-15 DIAGNOSIS — I10 Essential (primary) hypertension: Secondary | ICD-10-CM

## 2022-11-15 DIAGNOSIS — R4589 Other symptoms and signs involving emotional state: Secondary | ICD-10-CM | POA: Diagnosis not present

## 2022-11-15 DIAGNOSIS — R632 Polyphagia: Secondary | ICD-10-CM | POA: Diagnosis not present

## 2022-11-15 DIAGNOSIS — R5383 Other fatigue: Secondary | ICD-10-CM | POA: Diagnosis not present

## 2022-11-15 DIAGNOSIS — Z6830 Body mass index (BMI) 30.0-30.9, adult: Secondary | ICD-10-CM | POA: Insufficient documentation

## 2022-11-15 DIAGNOSIS — E669 Obesity, unspecified: Secondary | ICD-10-CM

## 2022-11-15 DIAGNOSIS — Z1331 Encounter for screening for depression: Secondary | ICD-10-CM

## 2022-11-15 MED ORDER — WEGOVY 2.4 MG/0.75ML ~~LOC~~ SOAJ
2.4000 mg | SUBCUTANEOUS | 0 refills | Status: DC
Start: 2022-11-15 — End: 2022-11-30

## 2022-11-17 LAB — TSH: TSH: 4.13 u[IU]/mL (ref 0.450–4.500)

## 2022-11-17 LAB — COMPREHENSIVE METABOLIC PANEL
ALT: 38 IU/L — ABNORMAL HIGH (ref 0–32)
AST: 21 IU/L (ref 0–40)
Albumin: 4.4 g/dL (ref 3.9–4.9)
Alkaline Phosphatase: 102 IU/L (ref 44–121)
BUN/Creatinine Ratio: 16 (ref 12–28)
BUN: 10 mg/dL (ref 8–27)
Bilirubin Total: 0.4 mg/dL (ref 0.0–1.2)
CO2: 27 mmol/L (ref 20–29)
Calcium: 9.7 mg/dL (ref 8.7–10.3)
Chloride: 96 mmol/L (ref 96–106)
Creatinine, Ser: 0.61 mg/dL (ref 0.57–1.00)
Globulin, Total: 2.6 g/dL (ref 1.5–4.5)
Glucose: 85 mg/dL (ref 70–99)
Potassium: 3.8 mmol/L (ref 3.5–5.2)
Sodium: 139 mmol/L (ref 134–144)
Total Protein: 7 g/dL (ref 6.0–8.5)
eGFR: 96 mL/min/{1.73_m2} (ref >59)

## 2022-11-17 LAB — CBC WITH DIFFERENTIAL/PLATELET
Basophils Absolute: 0.1 10*3/uL (ref 0.0–0.2)
Basos: 1 %
EOS (ABSOLUTE): 0.3 10*3/uL (ref 0.0–0.4)
Eos: 3 %
Hematocrit: 38.1 % (ref 34.0–46.6)
Hemoglobin: 13 g/dL (ref 11.1–15.9)
Immature Grans (Abs): 0 10*3/uL (ref 0.0–0.1)
Immature Granulocytes: 0 %
Lymphocytes Absolute: 1.7 10*3/uL (ref 0.7–3.1)
Lymphs: 19 %
MCH: 31.2 pg (ref 26.6–33.0)
MCHC: 34.1 g/dL (ref 31.5–35.7)
MCV: 91 fL (ref 79–97)
Monocytes Absolute: 0.5 10*3/uL (ref 0.1–0.9)
Monocytes: 6 %
Neutrophils Absolute: 6.5 10*3/uL (ref 1.4–7.0)
Neutrophils: 71 %
Platelets: 309 10*3/uL (ref 150–450)
RBC: 4.17 x10E6/uL (ref 3.77–5.28)
RDW: 13.1 % (ref 11.7–15.4)
WBC: 9.1 10*3/uL (ref 3.4–10.8)

## 2022-11-17 LAB — HEMOGLOBIN A1C
Est. average glucose Bld gHb Est-mCnc: 103 mg/dL
Hgb A1c MFr Bld: 5.2 % (ref 4.8–5.6)

## 2022-11-17 LAB — LIPID PANEL
Chol/HDL Ratio: 2.8 ratio (ref 0.0–4.4)
Cholesterol, Total: 210 mg/dL — ABNORMAL HIGH (ref 100–199)
HDL: 76 mg/dL (ref >39)
LDL Chol Calc (NIH): 113 mg/dL — ABNORMAL HIGH (ref 0–99)
Triglycerides: 121 mg/dL (ref 0–149)
VLDL Cholesterol Cal: 21 mg/dL (ref 5–40)

## 2022-11-17 LAB — INSULIN, RANDOM: INSULIN: 17.7 u[IU]/mL (ref 2.6–24.9)

## 2022-11-17 LAB — T4, FREE: Free T4: 1.1 ng/dL (ref 0.82–1.77)

## 2022-11-17 LAB — VITAMIN D 25 HYDROXY (VIT D DEFICIENCY, FRACTURES): Vit D, 25-Hydroxy: 27.4 ng/mL — ABNORMAL LOW (ref 30.0–100.0)

## 2022-11-17 LAB — VITAMIN B12: Vitamin B-12: 266 pg/mL (ref 232–1245)

## 2022-11-17 LAB — FOLATE: Folate: <2 ng/mL — ABNORMAL LOW (ref >3.0)
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
## 2022-11-29 ENCOUNTER — Ambulatory Visit (INDEPENDENT_AMBULATORY_CARE_PROVIDER_SITE_OTHER): Payer: Medicare PPO | Admitting: Family Medicine

## 2022-11-30 ENCOUNTER — Encounter (INDEPENDENT_AMBULATORY_CARE_PROVIDER_SITE_OTHER): Payer: Self-pay | Admitting: Family Medicine

## 2022-11-30 ENCOUNTER — Ambulatory Visit (INDEPENDENT_AMBULATORY_CARE_PROVIDER_SITE_OTHER): Payer: Medicare PPO | Admitting: Family Medicine

## 2022-11-30 VITALS — BP 116/76 | HR 78 | Temp 98.1°F | Ht 67.0 in | Wt 194.0 lb

## 2022-11-30 DIAGNOSIS — E669 Obesity, unspecified: Secondary | ICD-10-CM

## 2022-11-30 DIAGNOSIS — E559 Vitamin D deficiency, unspecified: Secondary | ICD-10-CM | POA: Diagnosis not present

## 2022-11-30 DIAGNOSIS — E7800 Pure hypercholesterolemia, unspecified: Secondary | ICD-10-CM

## 2022-11-30 DIAGNOSIS — E538 Deficiency of other specified B group vitamins: Secondary | ICD-10-CM | POA: Diagnosis not present

## 2022-11-30 DIAGNOSIS — Z6830 Body mass index (BMI) 30.0-30.9, adult: Secondary | ICD-10-CM

## 2022-11-30 DIAGNOSIS — R632 Polyphagia: Secondary | ICD-10-CM | POA: Diagnosis not present

## 2022-11-30 MED ORDER — VITAMIN B-12 1000 MCG PO TABS
1000.0000 ug | ORAL_TABLET | Freq: Every day | ORAL | 0 refills | Status: AC
Start: 2022-11-30 — End: ?

## 2022-11-30 MED ORDER — WEGOVY 2.4 MG/0.75ML ~~LOC~~ SOAJ
2.4000 mg | SUBCUTANEOUS | 0 refills | Status: DC
Start: 2022-11-30 — End: 2023-02-15

## 2022-11-30 MED ORDER — VITAMIN D (ERGOCALCIFEROL) 1.25 MG (50000 UNIT) PO CAPS
50000.0000 [IU] | ORAL_CAPSULE | ORAL | 0 refills | Status: DC
Start: 2022-11-30 — End: 2022-12-25

## 2022-11-30 MED ORDER — FOLIC ACID 1 MG PO TABS
1.0000 mg | ORAL_TABLET | Freq: Every day | ORAL | 2 refills | Status: AC
Start: 2022-11-30 — End: ?

## 2022-11-30 NOTE — Assessment & Plan Note (Signed)
Improving on Wegovy with adequate satiety Denies meal skipping or GI SE Has improved intake of lean protein and fiber with meals  Continue prescribed diet with modifications reviewed today Increase water intake to 80 oz/ day Continue Wegovy 2.4 mg weekly

## 2022-11-30 NOTE — Progress Notes (Signed)
Office: [phone]  /  Fax: [phone]  WEIGHT SUMMARY AND BIOMETRICS  Starting Date: 11/15/22  Starting Weight: 199lb   Weight Lost Since Last Visit: 5lb   Vitals Temp: 98.1 F (36.7 C) BP: 116/76 Pulse Rate: 78 SpO2: 98 %   Body Composition  Body Fat %: 40.9 % Fat Mass (lbs): 79.4 lbs Muscle Mass (lbs): 109 lbs Total Body Water (lbs): 73.6 lbs Visceral Fat Rating : 12    HPI  Chief Complaint: OBESITY  Robin Mills is here to discuss her progress with her obesity treatment plan. She is on the the Category 2 Plan and states she is following her eating plan approximately 80 % of the time. She states she is exercising 30 minutes 3 times per week.  Interval History:  Since last office visit she is down 5 lb She is on Wegovy 2.4 mg weekly injection ( RX refilled here ) She is getting more walking in plans to do more She is not eating the low calorie bread- didn't like She is not skipping meals with Wegovy and still feels satiety Denies GI upset  She has added in more fruit to her meal plan and tries to make good choices when eating out She has a cruise coming up  Pharmacotherapy: Wegovy 2.4 mg weekly  PHYSICAL EXAM:  Blood pressure 116/76, pulse 78, temperature 98.1 F (36.7 C), height 5\' 7"  (1.702 m), weight 194 lb (88 kg), SpO2 98%. Body mass index is 30.38 kg/m.  General: She is overweight, cooperative, alert, well developed, and in no acute distress. PSYCH: Has normal mood, affect and thought process.   Lungs: Normal breathing effort, no conversational dyspnea.   ASSESSMENT AND PLAN  TREATMENT PLAN FOR OBESITY:  Recommended Dietary Goals  Nkauj is currently in the action stage of change. As such, her goal is to continue weight management plan. She has agreed to the Category 2 Plan. - can have 2 servings of any fruit daily - can swap out low cal bread for: 100 calorie [employer], a low carb wrap or [employer] Lavish Bread - allow one  serving of a high fiber starch with dinner if needed  Behavioral Intervention  We discussed the following Behavioral Modification Strategies today: increasing lean protein intake, decreasing simple carbohydrates , increasing vegetables, increasing lower glycemic fruits, increasing fiber rich foods, increasing water intake, keeping healthy foods at home, avoiding temptations and identifying enticing environmental cues, continue to practice mindfulness when eating, and planning for success.  Additional resources provided today: NA  Recommended Physical Activity Goals  Karyme has been advised to work up to 150 minutes of moderate intensity aerobic activity a week and strengthening exercises 2-3 times per week for cardiovascular health, weight loss maintenance and preservation of muscle mass.   She has agreed to [employer] strengthening exercises with a goal of 2-3 sessions a week   Pharmacotherapy changes for the treatment of obesity: none  ASSOCIATED CONDITIONS ADDRESSED TODAY  Pure hypercholesterolemia Assessment & Plan: Reviewed lab from last visit.  She has a + fam hx of heart disease and has never taken cholesterol lowering medication.  She is intermediate risk based on her risk score.  She would like to repeat FLP in 3 mos now that she is on a low saturated fat meal plan and actively losing weight. The 10-year ASCVD risk score (Arnett DK, et al., 2019) is: 10%   Values used to calculate the score:     Age: 89 years     Sex:  Female     Is Non-Hispanic African American: No     Diabetic: No     Tobacco smoker: No     Systolic Blood Pressure: 116 mmHg     Is BP treated: Yes     HDL Cholesterol: 76 mg/dL     Total Cholesterol: 210 mg/dL    Polyphagia Assessment & Plan: Improving on Wegovy with adequate satiety Denies meal skipping or GI SE Has improved intake of lean protein and fiber with meals  Continue prescribed diet with modifications reviewed today Increase water intake to  80 oz/ day Continue Wegovy 2.4 mg weekly  Orders: -     ZOXWRU; Inject 2.4 mg into the skin once a week.  Dispense: 2 mL; Refill: 0  Folate deficiency Assessment & Plan: New.  Reviewed lab from last visit.  She c/o fatigue but denies neuropathic symptoms.  Consumes green leafy veggies, dairy and legumes.  Denies previous use of methotrexate. Lab Results  Component Value Date   FOLATE <2.0 (L) 11/15/2022   Begin RX Folic acid 1 mg daily. Repeat level in 2-3 mos  Orders: -     Folic Acid; Take 1 tablet (1 mg total) by mouth daily.  Dispense: 30 tablet; Refill: 2  B12 deficiency Assessment & Plan: Lab Results  Component Value Date   VITAMINB12 266 11/15/2022   Reviewed lab from last visit B12 in the low normal range Denies paresthesias or memory loss but does have some fatigue Denies vegan diet, denies previous bariatric surgery or colon resection Currently taking no supplements Reports a + fam hx of B12 def in her mom (? Pernicious anemia)  Begin RX vitamin B12 1,000 mcg daily Recheck level in 2-3 mos  Orders: -     Vitamin B-12; Take 1 tablet (1,000 mcg total) by mouth daily.  Dispense: 90 tablet; Refill: 0  Vitamin D deficiency Assessment & Plan: Last vitamin D Lab Results  Component Value Date   VD25OH 27.4 (L) 11/15/2022   Reviewed lab from last visit and discussed vitamin D's importance in immune function, energy levels, bone health She is currently not taking a vitamin D supplement  Recommend DEXA every 2-3 years Begin RX vitamin D 50,000 international units  once weekly and recheck level in 3 mos  Orders: -     Vitamin D (Ergocalciferol); Take 1 capsule (50,000 Units total) by mouth every 7 (seven) days.  Dispense: 5 capsule; Refill: 0  Obesity, Class I, BMI 30-34.9  BMI 30.0-30.9,adult      She was informed of the importance of frequent follow up visits to maximize her success with intensive lifestyle modifications for her multiple health  conditions.   ATTESTASTION STATEMENTS:  Reviewed by clinician on day of visit: allergies, medications, problem list, medical history, surgical history, family history, social history, and previous encounter notes pertinent to obesity diagnosis.   I have personally spent 30 minutes total time today in preparation, patient care, nutritional counseling and documentation for this visit, including the following: review of clinical lab tests; review of medical tests/procedures/services.      Glennis Brink, DO DABFM, DABOM [facility] [address] [place], [place] [address] [phone]

## 2022-11-30 NOTE — Assessment & Plan Note (Addendum)
New.  Reviewed lab from last visit.  She c/o fatigue but denies neuropathic symptoms.  Consumes green leafy veggies, dairy and legumes.  Denies previous use of methotrexate. Lab Results  Component Value Date   FOLATE <2.0 (L) 11/15/2022   Begin RX Folic acid 1 mg daily. Repeat level in 2-3 mos

## 2022-11-30 NOTE — Assessment & Plan Note (Addendum)
Reviewed lab from last visit.  She has a + fam hx of heart disease and has never taken cholesterol lowering medication.  She is intermediate risk based on her risk score.  She would like to repeat FLP in 3 mos now that she is on a low saturated fat meal plan and actively losing weight. The 10-year ASCVD risk score (Arnett DK, et al., 2019) is: 10%   Values used to calculate the score:     Age: 72 years     Sex: Female     Is Non-Hispanic African American: No     Diabetic: No     Tobacco smoker: No     Systolic Blood Pressure: 116 mmHg     Is BP treated: Yes     HDL Cholesterol: 76 mg/dL     Total Cholesterol: 210 mg/dL

## 2022-11-30 NOTE — Assessment & Plan Note (Addendum)
Lab Results  Component Value Date   VITAMINB12 266 11/15/2022   Reviewed lab from last visit B12 in the low normal range Denies paresthesias or memory loss but does have some fatigue Denies vegan diet, denies previous bariatric surgery or colon resection Currently taking no supplements Reports a + fam hx of B12 def in her mom (? Pernicious anemia)  Begin RX vitamin B12 1,000 mcg daily Recheck level in 2-3 mos

## 2022-11-30 NOTE — Assessment & Plan Note (Signed)
Last vitamin D Lab Results  Component Value Date   VD25OH 27.4 (L) 11/15/2022   Reviewed lab from last visit and discussed vitamin D's importance in immune function, energy levels, bone health She is currently not taking a vitamin D supplement  Recommend DEXA every 2-3 years Begin RX vitamin D 50,000 international units  once weekly and recheck level in 3 mos

## 2022-12-13 IMAGING — DX DG CHEST 2V
2 series · 2 of 2 positions shown · non-contrast
Comparison: None.

CLINICAL DATA: Chest pressure, shortness of breath with exertion.

EXAM:
CHEST - 2 VIEW

[chest pa]
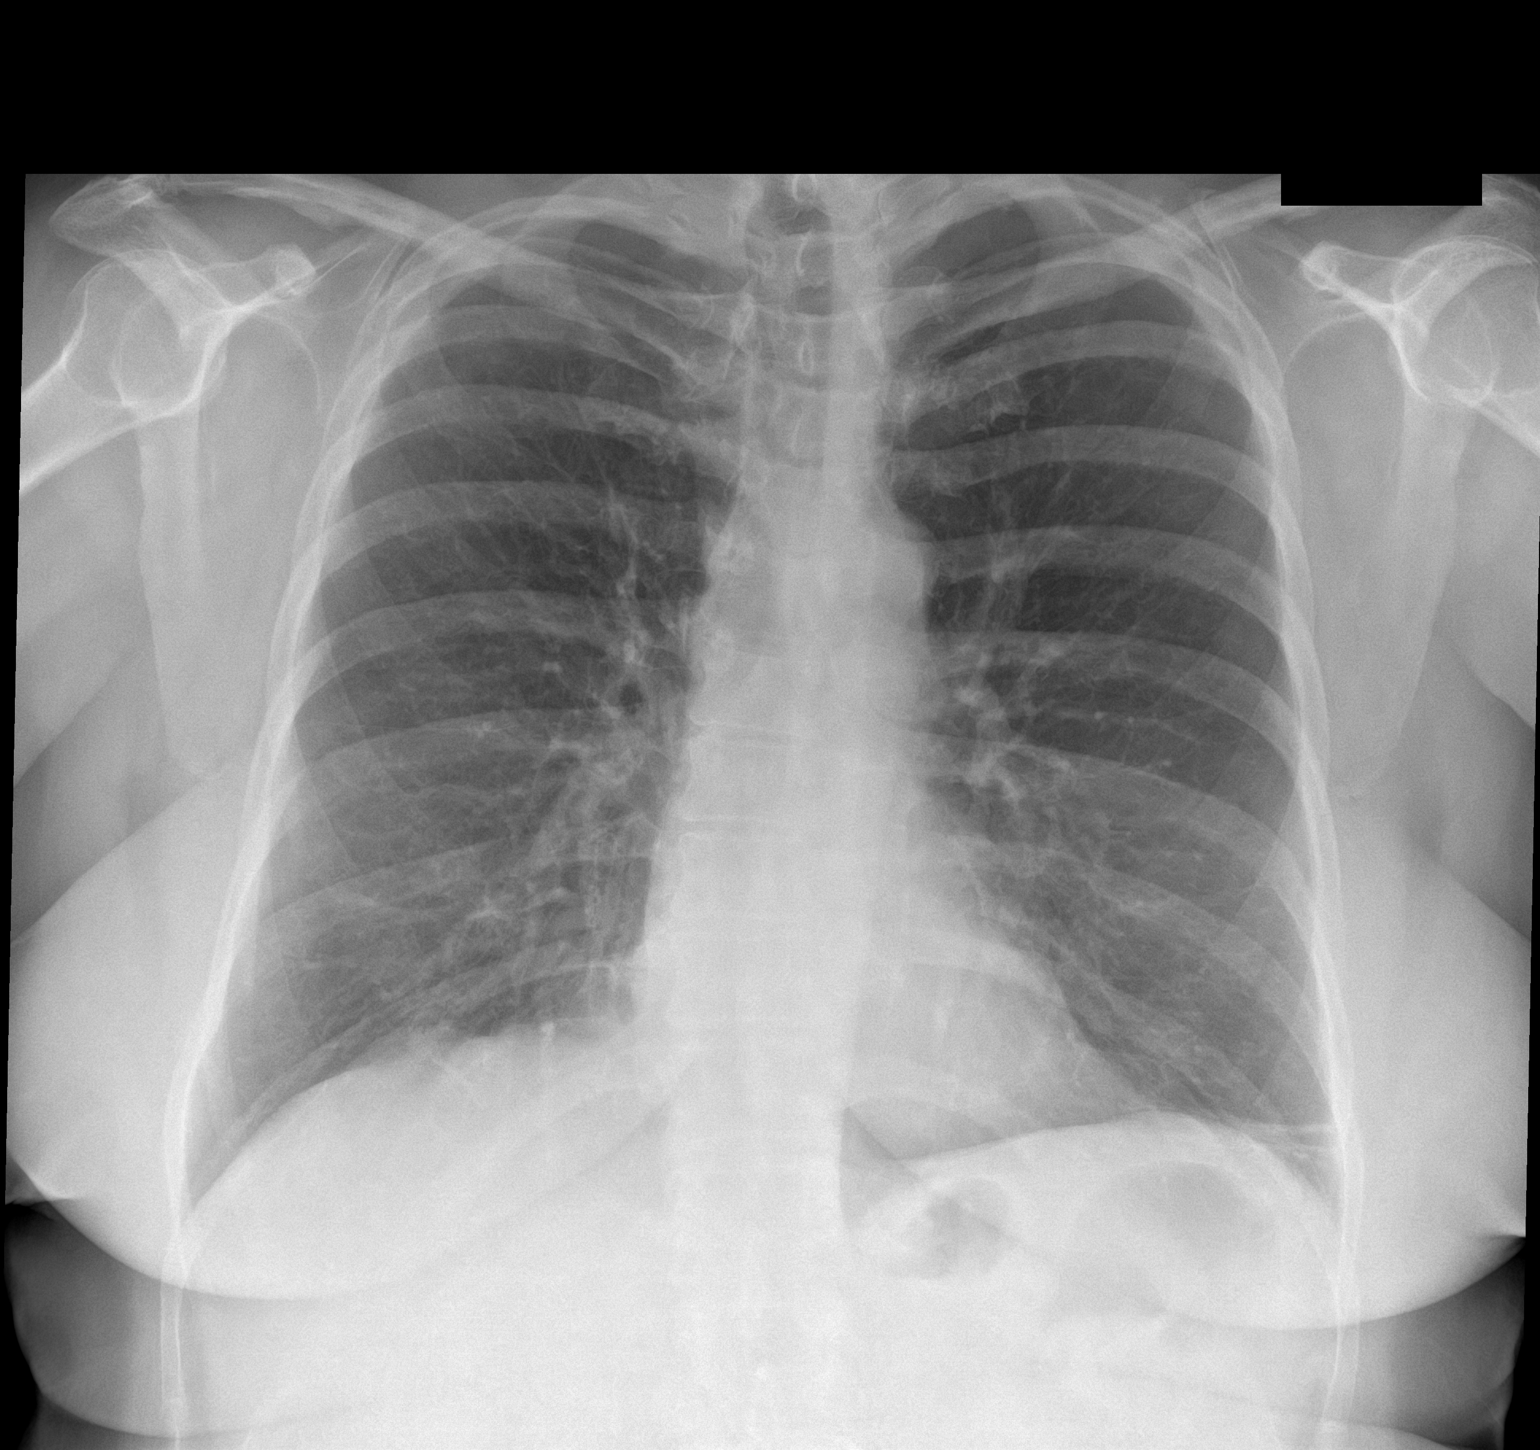

[chest lat]
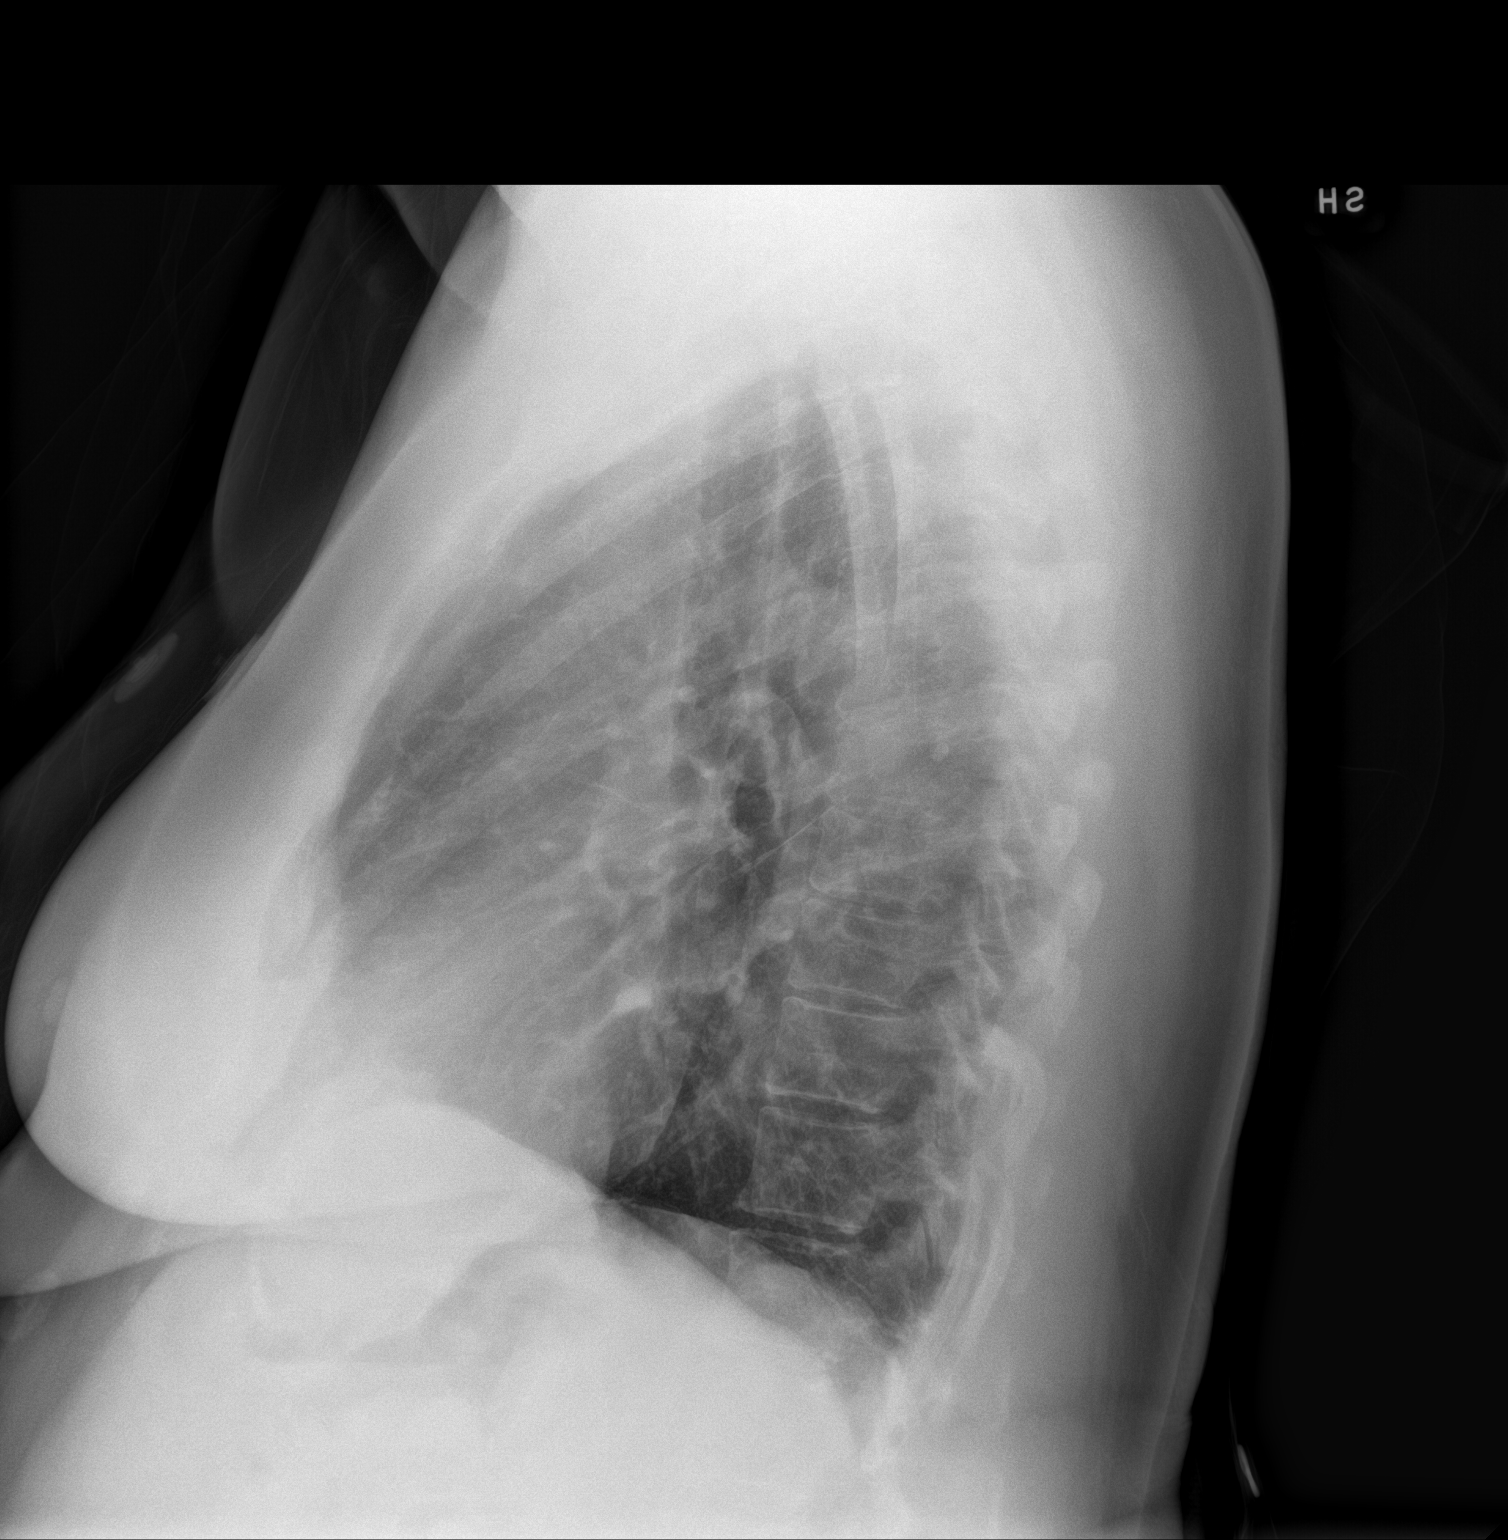

[2 of 2 positions shown; findings below may reference images not displayed]

FINDINGS: Linear densities at the left lung base are suggestive for
atelectasis or scarring. Otherwise, the lungs are clear. No
pulmonary edema. Heart and mediastinum are within normal limits.
Trachea is midline. No large pleural effusions. No acute bone
abnormality.
IMPRESSION: No active cardiopulmonary disease.

## 2022-12-25 ENCOUNTER — Ambulatory Visit (INDEPENDENT_AMBULATORY_CARE_PROVIDER_SITE_OTHER): Payer: Medicare PPO | Admitting: Family Medicine

## 2022-12-25 ENCOUNTER — Encounter (INDEPENDENT_AMBULATORY_CARE_PROVIDER_SITE_OTHER): Payer: Self-pay | Admitting: Family Medicine

## 2022-12-25 VITALS — BP 116/74 | HR 92 | Temp 97.6°F | Ht 67.0 in | Wt 191.0 lb

## 2022-12-25 DIAGNOSIS — Z6829 Body mass index (BMI) 29.0-29.9, adult: Secondary | ICD-10-CM

## 2022-12-25 DIAGNOSIS — E538 Deficiency of other specified B group vitamins: Secondary | ICD-10-CM | POA: Diagnosis not present

## 2022-12-25 DIAGNOSIS — E669 Obesity, unspecified: Secondary | ICD-10-CM

## 2022-12-25 DIAGNOSIS — E88819 Insulin resistance, unspecified: Secondary | ICD-10-CM | POA: Diagnosis not present

## 2022-12-25 DIAGNOSIS — E559 Vitamin D deficiency, unspecified: Secondary | ICD-10-CM

## 2022-12-25 MED ORDER — VITAMIN D (ERGOCALCIFEROL) 1.25 MG (50000 UNIT) PO CAPS
50000.0000 [IU] | ORAL_CAPSULE | ORAL | 0 refills | Status: AC
Start: 2022-12-25 — End: ?

## 2022-12-25 NOTE — Progress Notes (Signed)
Chief Complaint:   OBESITY Robin Mills is here to discuss her progress with her obesity treatment plan along with follow-up of her obesity related diagnoses. Robin Mills is on the Category 2 Plan and states she is following her eating plan approximately 40% of the time. Robin Mills states she has been doing a lot of walking.    Today's visit was #: 3 Starting weight: 199 lbs Starting date: 11/15/2022 Today's weight: 191 lbs Today's date: 12/25/2022 Total lbs lost to date: 8 Total lbs lost since last in-office visit: 3  Interim History: Patient went to [place] with friends since last appointment.  Cruise as 5 days long and she really enjoyed herself. She was about 40% compliant to meal plan since last appointment.  She has already started back to Category plan.  She does not like that she isn't eating dessert and doesn't like the delightful bread.  Subjective:   1. Vitamin D deficiency Patient is on prescription vitamin D with a goal of 60-80.  She denies nausea, vomiting, or muscle weakness but notes fatigue.  2. Folate deficiency Patient's last folate was <2.0, and she notes fatigue.  She is on OTC folic acid.  3. Insulin resistance Patient's last A1c was 5.2 and insulin 17.7.  She is not on medications.  She still notes some carbohydrate cravings.  Assessment/Plan:   1. Vitamin D deficiency Patient will continue prescription vitamin D once weekly, and we will refill for 1 month.  - Vitamin D, Ergocalciferol, (DRISDOL) 1.25 MG (50000 UNIT) CAPS capsule; Take 1 capsule (50,000 Units total) by mouth every 7 (seven) days.  Dispense: 5 capsule; Refill: 0  2. Folate deficiency Patient is to stop OTC individual folic acid replacement, and start prenatal vitamins.  3. Insulin resistance We will repeat labs in November.  4. BMI 29.0-29.9,adult  5. Obesity with starting BMI of 30.8 Robin Mills is currently in the action stage of change. As such, her goal is to continue with weight loss  efforts. She has agreed to keeping a food journal and adhering to recommended goals of 1200-1400 calories and 85+ grams of protein daily.   Exercise goals: All adults should avoid inactivity. Some physical activity is better than none, and adults who participate in any amount of physical activity gain some health benefits.  Behavioral modification strategies: increasing lean protein intake, meal planning and cooking strategies, keeping healthy foods in the home, and planning for success.  Robin Mills has agreed to follow-up with our clinic in 3 weeks. She was informed of the importance of frequent follow-up visits to maximize her success with intensive lifestyle modifications for her multiple health conditions.   Objective:   Blood pressure 116/74, pulse 92, temperature 97.6 F (36.4 C), height 5\' 7"  (1.702 m), weight 191 lb (86.6 kg), SpO2 99%. Body mass index is 29.91 kg/m.  General: Cooperative, alert, well developed, in no acute distress. HEENT: Conjunctivae and lids unremarkable. Cardiovascular: Regular rhythm.  Lungs: Normal work of breathing. Neurologic: No focal deficits.   Lab Results  Component Value Date   CREATININE 0.61 11/15/2022   BUN 10 11/15/2022   NA 139 11/15/2022   K 3.8 11/15/2022   CL 96 11/15/2022   CO2 27 11/15/2022   Lab Results  Component Value Date   ALT 38 (H) 11/15/2022   AST 21 11/15/2022   ALKPHOS 102 11/15/2022   BILITOT 0.4 11/15/2022   Lab Results  Component Value Date   HGBA1C 5.2 11/15/2022   Lab Results  Component Value Date  INSULIN 17.7 11/15/2022   Lab Results  Component Value Date   TSH 4.130 11/15/2022   Lab Results  Component Value Date   CHOL 210 (H) 11/15/2022   HDL 76 11/15/2022   LDLCALC 113 (H) 11/15/2022   TRIG 121 11/15/2022   CHOLHDL 2.8 11/15/2022   Lab Results  Component Value Date   VD25OH 27.4 (L) 11/15/2022   Lab Results  Component Value Date   WBC 9.1 11/15/2022   HGB 13.0 11/15/2022   HCT 38.1  11/15/2022   MCV 91 11/15/2022   PLT 309 11/15/2022   No results found for: "IRON", "TIBC", "FERRITIN"  Attestation Statements:   Reviewed by clinician on day of visit: allergies, medications, problem list, medical history, surgical history, family history, social history, and previous encounter notes.   I, Burt Knack, am acting as transcriptionist for Reuben Likes, MD.  I have reviewed the above documentation for accuracy and completeness, and I agree with the above. - Reuben Likes, MD

## 2023-01-25 ENCOUNTER — Telehealth (INDEPENDENT_AMBULATORY_CARE_PROVIDER_SITE_OTHER): Payer: Self-pay | Admitting: Family Medicine

## 2023-01-25 NOTE — Telephone Encounter (Signed)
Patient called in to reschedule her appointment from 01/29/23 due to being out of town. Her rescheduled apppoinment is on 02/07/23 with Dr. Dalbert Garnet. Patient states she will be out of her [facility] and her prescription Vitamin D before the 9th and is requesting a refill be sent to [facility] on [address]. Patient states if there are any issues please call her back. Phone number confirmed.

## 2023-01-29 ENCOUNTER — Ambulatory Visit (INDEPENDENT_AMBULATORY_CARE_PROVIDER_SITE_OTHER): Payer: Medicare PPO | Admitting: Family Medicine

## 2023-01-30 ENCOUNTER — Other Ambulatory Visit (INDEPENDENT_AMBULATORY_CARE_PROVIDER_SITE_OTHER): Payer: Self-pay | Admitting: Family Medicine

## 2023-01-30 DIAGNOSIS — R632 Polyphagia: Secondary | ICD-10-CM

## 2023-01-30 DIAGNOSIS — E559 Vitamin D deficiency, unspecified: Secondary | ICD-10-CM

## 2023-02-01 ENCOUNTER — Ambulatory Visit (INDEPENDENT_AMBULATORY_CARE_PROVIDER_SITE_OTHER): Payer: Medicare PPO | Admitting: Family Medicine

## 2023-02-01 ENCOUNTER — Encounter (INDEPENDENT_AMBULATORY_CARE_PROVIDER_SITE_OTHER): Payer: Self-pay | Admitting: Family Medicine

## 2023-02-01 VITALS — BP 129/80 | HR 78 | Temp 98.2°F | Ht 67.0 in | Wt 198.0 lb

## 2023-02-01 DIAGNOSIS — Z6831 Body mass index (BMI) 31.0-31.9, adult: Secondary | ICD-10-CM | POA: Diagnosis not present

## 2023-02-01 DIAGNOSIS — R632 Polyphagia: Secondary | ICD-10-CM

## 2023-02-01 DIAGNOSIS — E538 Deficiency of other specified B group vitamins: Secondary | ICD-10-CM

## 2023-02-01 DIAGNOSIS — Z6830 Body mass index (BMI) 30.0-30.9, adult: Secondary | ICD-10-CM

## 2023-02-01 DIAGNOSIS — E559 Vitamin D deficiency, unspecified: Secondary | ICD-10-CM | POA: Diagnosis not present

## 2023-02-01 DIAGNOSIS — E66811 Obesity, class 1: Secondary | ICD-10-CM

## 2023-02-01 MED ORDER — VITAMIN B-12 1000 MCG PO TABS
1000.0000 ug | ORAL_TABLET | Freq: Every day | ORAL | 0 refills | Status: DC
Start: 2023-02-01 — End: 2023-03-01

## 2023-02-01 MED ORDER — FOLIC ACID 1 MG PO TABS: 1.0000 mg | ORAL_TABLET | Freq: Every day | ORAL | 2 refills | Status: DC

## 2023-02-01 MED ORDER — VITAMIN D (ERGOCALCIFEROL) 1.25 MG (50000 UNIT) PO CAPS
50000.0000 [IU] | ORAL_CAPSULE | ORAL | 0 refills | Status: DC
Start: 2023-02-01 — End: 2023-03-01

## 2023-02-01 MED ORDER — SEMAGLUTIDE-WEIGHT MANAGEMENT 2.4 MG/0.75ML ~~LOC~~ SOAJ
2.4000 mg | SUBCUTANEOUS | 0 refills | Status: DC
Start: 2023-02-01 — End: 2023-03-01

## 2023-02-01 NOTE — Progress Notes (Signed)
Chief Complaint:   OBESITY Robin Mills is here to discuss her progress with her obesity treatment plan along with follow-up of her obesity related diagnoses. Robin Mills is on keeping a food journal and adhering to recommended goals of 1200-1400 calories and 85+ grams of protein and states she is following her eating plan approximately 80% of the time. Robin Mills states she is walking for 60 minutes 3 times per week.  Today's visit was #: 4 Starting weight: 199 lbs Starting date: 11/15/2022 Today's weight: 198 lbs Today's date: 02/01/2023 Total lbs lost to date: 1 Total lbs lost since last in-office visit: 0  Interim History: Patient is working on being mindful of her food choices and she is trying to decrease simple carbohydrates.  She is exercising some and her goal is to increase this.  Subjective:   1. Polyphagia Patient is on Wegovy, and this was started by her GYN.  She denies nausea, vomiting, or abdominal pain.  2. B12 deficiency Patient is on B12, and she is due for labs as her last level was unusually low.  She recently changed to prenatal vitamins when her B12 ran out.  3. Vitamin D deficiency Patient is on vitamin D, and she changed to prenatal vitamins recently.  She is due for a recheck.  4. Folate deficiency Patient has low folic acid level, and may be due to decreased absorption or decreased nutrition.  Assessment/Plan:   1. Polyphagia Patient will continue Wegovy 2.4 mg once weekly, and we will refill for 90 days.  - Semaglutide-Weight Management 2.4 MG/0.75ML SOAJ; Inject 2.4 mg into the skin once a week.  Dispense: 9 mL; Refill: 0  2. B12 deficiency We will check labs today, and we will refill them B12 1000 mcg daily for 90 days.  We will follow-up at patient's next visit.  - cyanocobalamin (VITAMIN B12) 1000 MCG tablet; Take 1 tablet (1,000 mcg total) by mouth daily.  Dispense: 90 tablet; Refill: 0 - Vitamin B12  3. Vitamin D deficiency We will check labs  today, we will refill prescription vitamin D 50,000 IU once weekly for 1 month.  We will follow-up at patient's next visit.  - Vitamin D, Ergocalciferol, (DRISDOL) 1.25 MG (50000 UNIT) CAPS capsule; Take 1 capsule (50,000 Units total) by mouth every 7 (seven) days.  Dispense: 5 capsule; Refill: 0 - VITAMIN D 25 Hydroxy (Vit-D Deficiency, Fractures)  4. Folate deficiency We will check labs today, we will refill folic acid 1 mg once daily for 90 days.  We will follow-up at patient's next visit.  - folic acid (FOLVITE) 1 MG tablet; Take 1 tablet (1 mg total) by mouth daily.  Dispense: 30 tablet; Refill: 2 - Folate  5. BMI 31.0-31.9,adult  6. Obesity with starting BMI of 30.8 Robin Mills is currently in the action stage of change. As such, her goal is to continue with weight loss efforts. She has agreed to practicing portion control and making smarter food choices, such as increasing vegetables and decreasing simple carbohydrates.   Exercise goals: As is.   Behavioral modification strategies: increasing water intake.  Robin Mills has agreed to follow-up with our clinic in 2 to 3 weeks. She was informed of the importance of frequent follow-up visits to maximize her success with intensive lifestyle modifications for her multiple health conditions.   Robin Mills was informed we would discuss her lab results at her next visit unless there is a critical issue that needs to be addressed sooner. Robin Mills agreed to keep her next  visit at the agreed upon time to discuss these results.  Objective:   Blood pressure 129/80, pulse 78, temperature 98.2 F (36.8 C), height 5\' 7"  (1.702 m), weight 198 lb (89.8 kg), SpO2 95%. Body mass index is 31.01 kg/m.  Lab Results  Component Value Date   CREATININE 0.61 11/15/2022   BUN 10 11/15/2022   NA 139 11/15/2022   K 3.8 11/15/2022   CL 96 11/15/2022   CO2 27 11/15/2022   Lab Results  Component Value Date   ALT 38 (H) 11/15/2022   AST 21 11/15/2022   ALKPHOS  102 11/15/2022   BILITOT 0.4 11/15/2022   Lab Results  Component Value Date   HGBA1C 5.2 11/15/2022   Lab Results  Component Value Date   INSULIN 17.7 11/15/2022   Lab Results  Component Value Date   TSH 4.130 11/15/2022   Lab Results  Component Value Date   CHOL 210 (H) 11/15/2022   HDL 76 11/15/2022   LDLCALC 113 (H) 11/15/2022   TRIG 121 11/15/2022   CHOLHDL 2.8 11/15/2022   Lab Results  Component Value Date   VD25OH 27.4 (L) 11/15/2022   Lab Results  Component Value Date   WBC 9.1 11/15/2022   HGB 13.0 11/15/2022   HCT 38.1 11/15/2022   MCV 91 11/15/2022   PLT 309 11/15/2022   No results found for: "IRON", "TIBC", "FERRITIN"  Attestation Statements:   Reviewed by clinician on day of visit: allergies, medications, problem list, medical history, surgical history, family history, social history, and previous encounter notes.   I, Burt Knack, am acting as transcriptionist for Quillian Quince, MD.  I have reviewed the above documentation for accuracy and completeness, and I agree with the above. -  Quillian Quince, MD

## 2023-02-02 LAB — FOLATE: Folate: >20 ng/mL (ref >3.0)

## 2023-02-02 LAB — VITAMIN D 25 HYDROXY (VIT D DEFICIENCY, FRACTURES): Vit D, 25-Hydroxy: 49 ng/mL (ref 30.0–100.0)

## 2023-02-02 LAB — VITAMIN B12: Vitamin B-12: 353 pg/mL (ref 232–1245)

## 2023-02-07 ENCOUNTER — Ambulatory Visit (INDEPENDENT_AMBULATORY_CARE_PROVIDER_SITE_OTHER): Payer: Medicare PPO | Admitting: Family Medicine

## 2023-02-09 DIAGNOSIS — G4733 Obstructive sleep apnea (adult) (pediatric): Secondary | ICD-10-CM | POA: Diagnosis not present

## 2023-02-15 ENCOUNTER — Encounter (INDEPENDENT_AMBULATORY_CARE_PROVIDER_SITE_OTHER): Payer: Self-pay | Admitting: Physician Assistant

## 2023-02-15 ENCOUNTER — Ambulatory Visit (INDEPENDENT_AMBULATORY_CARE_PROVIDER_SITE_OTHER): Payer: Medicare PPO | Admitting: Physician Assistant

## 2023-02-15 VITALS — BP 106/67 | HR 76 | Temp 98.0°F | Ht 67.0 in | Wt 195.0 lb

## 2023-02-15 DIAGNOSIS — E538 Deficiency of other specified B group vitamins: Secondary | ICD-10-CM

## 2023-02-15 DIAGNOSIS — E559 Vitamin D deficiency, unspecified: Secondary | ICD-10-CM

## 2023-02-15 DIAGNOSIS — G4733 Obstructive sleep apnea (adult) (pediatric): Secondary | ICD-10-CM | POA: Diagnosis not present

## 2023-02-15 DIAGNOSIS — E66811 Obesity, class 1: Secondary | ICD-10-CM | POA: Diagnosis not present

## 2023-02-15 DIAGNOSIS — R632 Polyphagia: Secondary | ICD-10-CM | POA: Diagnosis not present

## 2023-02-15 DIAGNOSIS — Z6830 Body mass index (BMI) 30.0-30.9, adult: Secondary | ICD-10-CM

## 2023-02-15 NOTE — Progress Notes (Signed)
.[facility]  Office: [phone]  /  Fax: [phone]  WEIGHT SUMMARY AND BIOMETRICS  Vitals Temp: 98 F (36.7 C) BP: 106/67 Pulse Rate: 76 SpO2: 98 %   Anthropometric Measurements Height: 5\' 7"  (1.702 m) Weight: 195 lb (88.5 kg) BMI (Calculated): 30.53 Weight at Last Visit: 198 lb Weight Lost Since Last Visit: 3 lb Weight Gained Since Last Visit: 0 Starting Weight: 199 lb Total Weight Loss (lbs): 4 lb (1.814 kg) Peak Weight: 221 lb   Body Composition  Body Fat %: 40.5 % Fat Mass (lbs): 79.2 lbs Muscle Mass (lbs): 110.6 lbs Total Body Water (lbs): 74 lbs Visceral Fat Rating : 12   Other Clinical Data Fasting: yes Labs: no Today's Visit #: 5 Starting Date: 11/15/22     HPI  Chief Complaint: OBESITY  Robin Mills is here to discuss her progress with her obesity treatment plan. She is on the the Category 2 Plan and keeping a food journal and adhering to recommended goals of 1200-1400 calories and 85+ grams of protein and states she is following her eating plan approximately 80 % of the time. She states she is exercising walking 30 minutes 2 times per week.   Interval History:  Since last office visit she is down 3 lbs.  Bio impedence scale reviewed with the patient:  Muscle mass + 3 lbs Adipose mass - 6.70 lbs  A 72 year old female with a history of obesity, hypertension, and vitamin D and B 12 and folate deficiencies presents for a follow-up visit regarding her obesity treatment plan.   She reports an increased desire to consume sugar and has been traveling extensively, which has affected her control over her diet.  Despite these challenges, she has been making efforts to increase her physical activity, including walking. She has also been cooking more at home, focusing on low-carb meals.  The patient is currently on Wegovy for weight loss and has noticed some improvements, including a slight change in the fit of her clothes. However, she expresses concern about her  low energy levels and wonders if her sleep apnea and CPAP machine could be contributing factors. She reports using her CPAP machine every night but does not feel refreshed in the morning. She is due for a new machine and a sleep study this year and plans to follow up with her PCP.  The patient also mentions that she has been taking vitamin D and B12 supplements. Her recent lab results show that her vitamin D levels are close to the goal, and her B12 levels have improved but are still on the lower end. She has been taking the B12 orally but was advised to try sublingual administration for better absorption.  Pharmacotherapy: Wegovy 2.4 mg weekly. Denies mass in neck, dysphagia, dyspepsia, persistent hoarseness, abdominal pain, or N/V/Constipation or diarrhea. Has annual eye exam. Mood is stable.    TREATMENT PLAN FOR OBESITY:  Recommended Dietary Goals  Robin Mills is currently in the action stage of change. As such, her goal is to continue weight management plan. She has agreed to the Category 2 Plan and keeping a food journal and adhering to recommended goals of 1200-1400 calories and 85+ grams of protein.  Behavioral Intervention  We discussed the following Behavioral Modification Strategies today: continue to work on maintaining a reduced calorie state, getting the recommended amount of protein, incorporating whole foods, making healthy choices, staying well hydrated and practicing mindfulness when eating..  Additional resources provided today: NA  Recommended Physical Activity Goals  Robin Mills has been  advised to work up to 150 minutes of moderate intensity aerobic activity a week and strengthening exercises 2-3 times per week for cardiovascular health, weight loss maintenance and preservation of muscle mass.   She has agreed to Think about enjoyable ways to increase daily physical activity and overcoming barriers to exercise and Increase physical activity in their day and reduce sedentary time  (increase NEAT).   Pharmacotherapy We discussed various medication options to help Robin Mills with her weight loss efforts and we both agreed to continue Wegovy 2.4 mg weekly for .    Return in about 2 weeks (around 03/01/2023).Marland Kitchen She was informed of the importance of frequent follow up visits to maximize her success with intensive lifestyle modifications for her multiple health conditions.  PHYSICAL EXAM:  Blood pressure 106/67, pulse 76, temperature 98 F (36.7 C), height 5\' 7"  (1.702 m), weight 195 lb (88.5 kg), SpO2 98%. Body mass index is 30.54 kg/m.  General: She is overweight, cooperative, alert, well developed, and in no acute distress. PSYCH: Has normal mood, affect and thought process.   Cardiovascular: HR 70's BP 106/67 Lungs: Normal breathing effort, no conversational dyspnea. Neuro: no focal deficits  DIAGNOSTIC DATA REVIEWED:  BMET    Component Value Date/Time   NA 139 11/15/2022 1337   K 3.8 11/15/2022 1337   CL 96 11/15/2022 1337   CO2 27 11/15/2022 1337   GLUCOSE 85 11/15/2022 1337   GLUCOSE 115 (H) 09/12/2021 1530   BUN 10 11/15/2022 1337   CREATININE 0.61 11/15/2022 1337   CREATININE 0.66 06/28/2014 1310   CALCIUM 9.7 11/15/2022 1337   GFRNONAA >60 09/12/2021 1530   GFRNONAA >89 06/28/2014 1310   GFRAA >90 06/29/2014 0528   GFRAA >89 06/28/2014 1310   Lab Results  Component Value Date   HGBA1C 5.2 11/15/2022   Lab Results  Component Value Date   INSULIN 17.7 11/15/2022   Lab Results  Component Value Date   TSH 4.130 11/15/2022   CBC    Component Value Date/Time   WBC 9.1 11/15/2022 1337   WBC 10.6 (H) 12/17/2020 1350   RBC 4.17 11/15/2022 1337   RBC 4.16 12/17/2020 1350   HGB 13.0 11/15/2022 1337   HCT 38.1 11/15/2022 1337   PLT 309 11/15/2022 1337   MCV 91 11/15/2022 1337   MCH 31.2 11/15/2022 1337   MCH 29.3 12/17/2020 1350   MCHC 34.1 11/15/2022 1337   MCHC 33.3 12/17/2020 1350   RDW 13.1 11/15/2022 1337   Iron Studies No  results found for: "IRON", "TIBC", "FERRITIN", "IRONPCTSAT" Lipid Panel     Component Value Date/Time   CHOL 210 (H) 11/15/2022 1337   TRIG 121 11/15/2022 1337   HDL 76 11/15/2022 1337   CHOLHDL 2.8 11/15/2022 1337   LDLCALC 113 (H) 11/15/2022 1337   Hepatic Function Panel     Component Value Date/Time   PROT 7.0 11/15/2022 1337   ALBUMIN 4.4 11/15/2022 1337   AST 21 11/15/2022 1337   ALT 38 (H) 11/15/2022 1337   ALKPHOS 102 11/15/2022 1337   BILITOT 0.4 11/15/2022 1337      Component Value Date/Time   TSH 4.130 11/15/2022 1337   Nutritional Lab Results  Component Value Date   VD25OH 49.0 02/01/2023   VD25OH 27.4 (L) 11/15/2022    ASSOCIATED CONDITIONS ADDRESSED TODAY  ASSESSMENT AND PLAN  Problem List Items Addressed This Visit     Class 1 obesity with serious comorbidity and body mass index (BMI) of 30.0 to 30.9 in adult  Polyphagia - Primary   OSA (obstructive sleep apnea)   BMI 30.0-30.9,adult   Folate deficiency   B12 deficiency   Vitamin D deficiency  Obesity with Polyphagia:  Endorses some increase in cravings lately, but also more indulgences while traveling.  She has lost 6 pounds of adipose tissue and gained 3 pounds of muscle mass on Wegovy at max dose. Discussed the importance of maintaining muscle mass.  The patient asked about  the potential benefit of switching to Zepbound for further weight loss. -Continue Wegovy 2.4 mg weekly ( No refill needed this visit) and discuss potential switch to Zepbound with Dr. Dalbert Garnet at visit in 2 weeks.. -Consider Factor meal delivery for help with meal planning.  -Consider working with a gym trainer to maintain muscle mass.  Vitamin D deficiency Labs were reviewed today and discussed with the patient.  Endorses fatigue.  Improved to near goal with weekly Ergocalciferol 50,000 IU supplementation. Discussed potential future reduction in dose or switch to over-the-counter supplementation. -Continue Ergocalciferol   50,000 IU weekly until completed course, then likely transition to OTC vitamin D. No refills needed this visit.   Vitamin B12 deficiency Labs were reviewed today and discussed with the patient.  Endorses fatigue.  B 12 levels Improved but still below goal of > 500. Discussed potential improved absorption with sublingual administration. -Continue current Vitamin B12 1000 mcg daily.supplementation Continue B 12 rich nutrition plan.   Folate deficiency: Labs were reviewed today and discussed with the patient.  Folate levels now in normal range. On folate 1 mg daily. Endorses fatigue.  Plan: Continue folate 1 mg daily.   Sleep Apnea using CPAP Patient reports not feeling rested despite using CPAP machine nightly. Discussed potential need for adjustment or replacement of CPAP machine. -Follow up with primary care provider regarding potential need for adjustment or replacement of CPAP machine.  ATTESTASTION STATEMENTS:  Reviewed by clinician on day of visit: allergies, medications, problem list, medical history, surgical history, family history, social history, and previous encounter notes.   I have personally spent 50 minutes total time today in preparation, patient care, nutritional counseling and documentation for this visit, including the following: review of clinical lab tests; review of medical tests/procedures/services.      Bessy Reaney, PA-C

## 2023-03-01 ENCOUNTER — Ambulatory Visit (INDEPENDENT_AMBULATORY_CARE_PROVIDER_SITE_OTHER): Payer: Medicare PPO | Admitting: Family Medicine

## 2023-03-01 ENCOUNTER — Encounter (INDEPENDENT_AMBULATORY_CARE_PROVIDER_SITE_OTHER): Payer: Self-pay | Admitting: Family Medicine

## 2023-03-01 VITALS — BP 124/71 | HR 76 | Temp 97.7°F | Ht 67.0 in | Wt 196.0 lb

## 2023-03-01 DIAGNOSIS — E669 Obesity, unspecified: Secondary | ICD-10-CM | POA: Diagnosis not present

## 2023-03-01 DIAGNOSIS — E559 Vitamin D deficiency, unspecified: Secondary | ICD-10-CM | POA: Diagnosis not present

## 2023-03-01 DIAGNOSIS — E538 Deficiency of other specified B group vitamins: Secondary | ICD-10-CM | POA: Diagnosis not present

## 2023-03-01 DIAGNOSIS — Z6830 Body mass index (BMI) 30.0-30.9, adult: Secondary | ICD-10-CM

## 2023-03-01 MED ORDER — VITAMIN B-12 1000 MCG PO TABS: 1000.0000 ug | ORAL_TABLET | Freq: Every day | ORAL | 0 refills | Status: DC

## 2023-03-01 MED ORDER — VITAMIN D (ERGOCALCIFEROL) 1.25 MG (50000 UNIT) PO CAPS
50000.0000 [IU] | ORAL_CAPSULE | ORAL | 0 refills | Status: DC
Start: 2023-03-01 — End: 2023-03-21

## 2023-03-01 MED ORDER — FOLIC ACID 1 MG PO TABS
1.0000 mg | ORAL_TABLET | Freq: Every day | ORAL | 2 refills | Status: DC
Start: 2023-03-01 — End: 2023-04-17

## 2023-03-01 MED ORDER — SEMAGLUTIDE-WEIGHT MANAGEMENT 2.4 MG/0.75ML ~~LOC~~ SOAJ
2.4000 mg | SUBCUTANEOUS | 0 refills | Status: DC
Start: 2023-03-01 — End: 2023-03-21

## 2023-03-01 NOTE — Progress Notes (Signed)
.sm[facility]  Office: [phone]  /  Fax: [phone]  WEIGHT SUMMARY AND BIOMETRICS  Anthropometric Measurements Height: 5\' 7"  (1.702 m) Weight: 196 lb (88.9 kg) BMI (Calculated): 30.69 Weight at Last Visit: 195 lb Weight Lost Since Last Visit: 0 Weight Gained Since Last Visit: 1 lb Starting Weight: 199 lb Total Weight Loss (lbs): 3 lb (1.361 kg) Peak Weight: 221 lb   Body Composition  Body Fat %: 41.5 % Fat Mass (lbs): 81.6 lbs Muscle Mass (lbs): 109 lbs Total Body Water (lbs): 74.6 lbs Visceral Fat Rating : 12   Other Clinical Data Fasting: no Labs: no Today's Visit #: 6 Starting Date: 11/15/22    Chief Complaint: OBESITY  History of Present Illness   The patient, diagnosed with obesity and deficiencies in vitamin D, folic acid, and B12, presents for a follow-up visit. Recent labs showed an improvement in folic acid and vitamin D levels, which are now at goal, but B12 remains low at 353. The patient has gained one pound in the last two weeks and reports adherence to the category two plan about 75% of the time. She is currently on Wegovy 2.4 mg for obesity management and reports no side effects.  The patient has been engaging in physical activity, walking for about an hour twice a week. She expresses a desire to join a gym but has not yet done so. She also acknowledges the need to increase her water intake.  The patient recently spent time with family, which involved some social eating. She reports that she was able to maintain portion control during this time, but acknowledges that some of the restaurant food consumed was not particularly healthy.  The patient is considering increasing her exercise regimen and is planning to join a gym. She expresses a preference for doing this independently rather than relying on a workout partner. She acknowledges that once she establishes a routine, maintaining it should not be an issue.  The patient is also considering starting a  regimen for potential constipation, as she has never been regular. However, she has not experienced any issues and has only taken laxatives once in her life.  She expresses a preference for easy, convenient meals and does not mind eating the same thing repeatedly if she likes it.  The patient is hosting a brunch and a wedding shower in the coming week, which may present some dietary challenges.          PHYSICAL EXAM:  Blood pressure 124/71, pulse 76, temperature 97.7 F (36.5 C), height 5\' 7"  (1.702 m), weight 196 lb (88.9 kg), SpO2 98%. Body mass index is 30.7 kg/m.  DIAGNOSTIC DATA REVIEWED:  BMET    Component Value Date/Time   NA 139 11/15/2022 1337   K 3.8 11/15/2022 1337   CL 96 11/15/2022 1337   CO2 27 11/15/2022 1337   GLUCOSE 85 11/15/2022 1337   GLUCOSE 115 (H) 09/12/2021 1530   BUN 10 11/15/2022 1337   CREATININE 0.61 11/15/2022 1337   CREATININE 0.66 06/28/2014 1310   CALCIUM 9.7 11/15/2022 1337   GFRNONAA >60 09/12/2021 1530   GFRNONAA >89 06/28/2014 1310   GFRAA >90 06/29/2014 0528   GFRAA >89 06/28/2014 1310   Lab Results  Component Value Date   HGBA1C 5.2 11/15/2022   Lab Results  Component Value Date   INSULIN 17.7 11/15/2022   Lab Results  Component Value Date   TSH 4.130 11/15/2022   CBC    Component Value Date/Time   WBC 9.1 11/15/2022  1337   WBC 10.6 (H) 12/17/2020 1350   RBC 4.17 11/15/2022 1337   RBC 4.16 12/17/2020 1350   HGB 13.0 11/15/2022 1337   HCT 38.1 11/15/2022 1337   PLT 309 11/15/2022 1337   MCV 91 11/15/2022 1337   MCH 31.2 11/15/2022 1337   MCH 29.3 12/17/2020 1350   MCHC 34.1 11/15/2022 1337   MCHC 33.3 12/17/2020 1350   RDW 13.1 11/15/2022 1337   Iron Studies No results found for: "IRON", "TIBC", "FERRITIN", "IRONPCTSAT" Lipid Panel     Component Value Date/Time   CHOL 210 (H) 11/15/2022 1337   TRIG 121 11/15/2022 1337   HDL 76 11/15/2022 1337   CHOLHDL 2.8 11/15/2022 1337   LDLCALC 113 (H) 11/15/2022 1337    Hepatic Function Panel     Component Value Date/Time   PROT 7.0 11/15/2022 1337   ALBUMIN 4.4 11/15/2022 1337   AST 21 11/15/2022 1337   ALT 38 (H) 11/15/2022 1337   ALKPHOS 102 11/15/2022 1337   BILITOT 0.4 11/15/2022 1337      Component Value Date/Time   TSH 4.130 11/15/2022 1337   Nutritional Lab Results  Component Value Date   VD25OH 49.0 02/01/2023   VD25OH 27.4 (L) 11/15/2022     Assessment and Plan    Obesity Weight gain of 1 pound over the last two weeks. Adherence to category two plan 75% of the time. Walking for exercise 2 times per week for an hour. On Wegovy 2.4mg  for weight management. Discussed the importance of regular exercise and hydration. -Continue Wegovy 2.4mg . -Encouraged to increase adherence to category two plan and regular exercise. -Refill [id number] prescription.  Vitamin B12 Deficiency Despite improvement in folic acid and vitamin D levels, B12 remains low at 353. -Refill B12 supplement prescription. -Recheck B12 levels in 3 months.  Vitamin D Deficiency Levels have improved and are now at goal. -Continue current Vitamin D supplementation. -Recheck Vitamin D levels in 3 months.  Folic Acid Deficiency Levels have improved and are now at goal. -Continue current Folic Acid supplementation. -Recheck Folic Acid levels in 3 months.          She was informed of the importance of frequent follow up visits to maximize her success with intensive lifestyle modifications for her multiple health conditions.    Quillian Quince, MD

## 2023-03-21 ENCOUNTER — Ambulatory Visit (INDEPENDENT_AMBULATORY_CARE_PROVIDER_SITE_OTHER): Payer: Medicare PPO | Admitting: Physician Assistant

## 2023-03-21 ENCOUNTER — Encounter (INDEPENDENT_AMBULATORY_CARE_PROVIDER_SITE_OTHER): Payer: Self-pay | Admitting: Physician Assistant

## 2023-03-21 VITALS — BP 108/69 | HR 78 | Temp 97.8°F | Ht 67.0 in | Wt 192.0 lb

## 2023-03-21 DIAGNOSIS — E559 Vitamin D deficiency, unspecified: Secondary | ICD-10-CM | POA: Diagnosis not present

## 2023-03-21 DIAGNOSIS — R632 Polyphagia: Secondary | ICD-10-CM

## 2023-03-21 DIAGNOSIS — Z6830 Body mass index (BMI) 30.0-30.9, adult: Secondary | ICD-10-CM | POA: Diagnosis not present

## 2023-03-21 DIAGNOSIS — E669 Obesity, unspecified: Secondary | ICD-10-CM | POA: Diagnosis not present

## 2023-03-21 DIAGNOSIS — E538 Deficiency of other specified B group vitamins: Secondary | ICD-10-CM

## 2023-03-21 MED ORDER — VITAMIN D (ERGOCALCIFEROL) 1.25 MG (50000 UNIT) PO CAPS: 50000.0000 [IU] | ORAL_CAPSULE | ORAL | 0 refills | Status: DC

## 2023-03-21 MED ORDER — SEMAGLUTIDE-WEIGHT MANAGEMENT 2.4 MG/0.75ML ~~LOC~~ SOAJ: 2.4000 mg | SUBCUTANEOUS | 0 refills | Status: DC

## 2023-03-21 NOTE — Progress Notes (Signed)
SUBJECTIVE:  Chief Complaint: Obesity  Interim History: The patient is a 72 year old individual with a history of obesity, vitamin D and B12 deficiency, and polyphagia. She has been following a treatment plan for obesity and has recently lost four pounds, despite a challenging month due to extensive travel. The patient has started exercising with a personal trainer and plans to increase gym visits to twice a week. However, she is about to start a full-time job, which may impact her exercise and diet routine.  The patient is proud of her progress and has been working on increasing water intake. She has been considering a meal delivery service, such as [employer], to help maintain her diet when she starts her new job. She has also been exploring options for easy-to-prepare meals, such as [employer] meats, to ensure she stays on track with her meal planning.  The patient has been taking Wegovy with no reported side effects such as nausea or constipation. She has noticed an improvement in her regularity since starting the medication. The patient has also built up a pound of muscle mass since her last visit and her overall body fat percentage has decreased.  The patient has expressed concern about potential changes to her insurance coverage for [employer]. She has received a notice from her insurance company suggesting that the cost of the medication may increase. Despite this, the patient is determined to continue her progress, as she has found the medication to be effective in her weight loss journey.  [facility] is here to discuss her progress with her obesity treatment plan. She is on the Category 2 Plan and states she is following her eating plan approximately 60 % of the time. She states she is exercising personal trainer 60 minutes 1-2 times per week.   OBJECTIVE: Visit Diagnoses: Problem List Items Addressed This Visit     Polyphagia   BMI 30.0-30.9,adult   Relevant Medications   Semaglutide-Weight  Management 2.4 MG/0.75ML SOAJ   Generalized obesity   Relevant Medications   Semaglutide-Weight Management 2.4 MG/0.75ML SOAJ   B12 deficiency   Vitamin D deficiency - Primary   Relevant Medications   Vitamin D, Ergocalciferol, (DRISDOL) 1.25 MG (50000 UNIT) CAPS capsule  Obesity Patient has lost four pounds since the last visit, and has started working with a Systems analyst, and aims to exercise twice a week.  Considering meal delivery [facility] due to a new full-time job.  Alternatives like [employer] meal delivery and [employer] pre-prepared meals were also discussed for convenience despite potential costs. Discussed [employer] benefits (weight loss, improved health metrics) and potential increased costs due to insurance changes.  - Continue/refill Wegovy 2.4 mg weekly x 1 month as prescribed - Encourage exercise at least twice a week - Consider [employer] meal delivery service - Consider [employer] pre-prepared meals  Vitamin D Deficiency Last vitamin D level improved at 49.0, but not yet at goal of 50-70.  No nausea/vomiting or muscle weakness with ergocalciferol 50,000 units once weekly Patient requires a refill for vitamin D supplements, with only two pills remaining. - Sent prescription for ergocalciferol 50,000 units weekly x 1 month to [employer] Low vitamin D levels can be associated with adiposity and may result in leptin resistance and weight gain. Also associated with fatigue. Currently on vitamin D supplementation without any adverse effects.  Recheck vitamin D level several times yearly to optimize supplementation/avoid oversupplementation.  Polyphagia Currently this is well controlled. Medication(s): Wegovy 2.4 mg SQ weekly.  Reported side effects: None Denies mass in neck,  dysphagia, dyspepsia, persistent hoarseness, abdominal pain, or N/V or diarrhea. Has annual eye exam. Mood is stable.  Reports was chronically constipated even prior to starting [facility] [employer] therapy.  She is managing this by  increasing water intake and monitoring fiber intake and reports she is at her baseline with constipation with no worsening on [employer] therapy.  Plan: Continue and refill Wegovy 2.4 mg SQ weekly Continue to work on nutrition plan and exercise to promote weight loss.  General Health Maintenance Advised to maintain hydration with 80-100 ounces of fluids per day and continue exercise routine for overall health and bowel regularity. - Maintain hydration with 80-100 ounces of fluids per day - Continue exercise routine to aid in bowel regularity  Follow-up - Follow-up appointment with [facility] on December 11th at 2:40 PM.  Vitals Temp: 97.8 F (36.6 C) BP: 108/69 Pulse Rate: 78 SpO2: 99 %   Anthropometric Measurements Height: 5\' 7"  (1.702 m) Weight: 192 lb (87.1 kg) BMI (Calculated): 30.06 Weight at Last Visit: 196 lb Weight Lost Since Last Visit: 4 lb Weight Gained Since Last Visit: 0 Starting Weight: 199 lb Total Weight Loss (lbs): 7 lb (3.175 kg) Peak Weight: 221 lb   Body Composition  Body Fat %: 39.7 % Fat Mass (lbs): 76.4 lbs Muscle Mass (lbs): 110 lbs Total Body Water (lbs): 72 lbs Visceral Fat Rating : 11   Other Clinical Data Fasting: no Labs: no Today's Visit #: 7 Starting Date: 11/15/22     ASSESSMENT AND PLAN:  Diet: [name] is currently in the action stage of change. As such, her goal is to continue with weight loss efforts. She has agreed to Category 2 Plan.  Exercise: [name] has been instructed to work up to a goal of 150 minutes of combined cardio and strengthening exercise per week for weight loss and overall health benefits.   Behavior Modification:  We discussed the following Behavioral Modification Strategies today: increasing lean protein intake, decreasing simple carbohydrates, increasing vegetables, increase H2O intake, increase high fiber foods, no skipping meals, meal planning and cooking strategies, and planning for success. We  discussed various medication options to help [name] with her weight loss efforts and we both agreed to continue Wegovy 2.4 mg weekly for medical weight loss.  Return in about 3 weeks (around 04/11/2023).Marland Kitchen She was informed of the importance of frequent follow up visits to maximize her success with intensive lifestyle modifications for her multiple health conditions.  Attestation Statements:   Reviewed by clinician on day of visit: allergies, medications, problem list, medical history, surgical history, family history, social history, and previous encounter notes.   Time spent on visit including pre-visit chart review and post-visit care and charting was 45 minutes.    Meric Joye, PA-C

## 2023-04-11 ENCOUNTER — Ambulatory Visit (INDEPENDENT_AMBULATORY_CARE_PROVIDER_SITE_OTHER): Payer: Medicare PPO | Admitting: Family Medicine

## 2023-04-17 ENCOUNTER — Ambulatory Visit (INDEPENDENT_AMBULATORY_CARE_PROVIDER_SITE_OTHER): Payer: Medicare PPO | Admitting: Physician Assistant

## 2023-04-17 ENCOUNTER — Encounter (INDEPENDENT_AMBULATORY_CARE_PROVIDER_SITE_OTHER): Payer: Self-pay | Admitting: Physician Assistant

## 2023-04-17 VITALS — BP 124/73 | HR 74 | Temp 97.8°F | Ht 67.0 in | Wt 194.0 lb

## 2023-04-17 DIAGNOSIS — Z6830 Body mass index (BMI) 30.0-30.9, adult: Secondary | ICD-10-CM

## 2023-04-17 DIAGNOSIS — R632 Polyphagia: Secondary | ICD-10-CM | POA: Diagnosis not present

## 2023-04-17 DIAGNOSIS — E669 Obesity, unspecified: Secondary | ICD-10-CM | POA: Diagnosis not present

## 2023-04-17 DIAGNOSIS — E538 Deficiency of other specified B group vitamins: Secondary | ICD-10-CM | POA: Diagnosis not present

## 2023-04-17 DIAGNOSIS — E559 Vitamin D deficiency, unspecified: Secondary | ICD-10-CM

## 2023-04-17 DIAGNOSIS — E66811 Body mass index (BMI) 31.0-31.9, adult: Secondary | ICD-10-CM

## 2023-04-17 MED ORDER — VITAMIN B-12 1000 MCG PO TABS: 1000.0000 ug | ORAL_TABLET | Freq: Every day | ORAL | 0 refills | Status: DC

## 2023-04-17 MED ORDER — SEMAGLUTIDE-WEIGHT MANAGEMENT 2.4 MG/0.75ML ~~LOC~~ SOAJ
2.4000 mg | SUBCUTANEOUS | 0 refills | Status: DC
Start: 2023-04-17 — End: 2023-07-04

## 2023-04-17 MED ORDER — VITAMIN D (ERGOCALCIFEROL) 1.25 MG (50000 UNIT) PO CAPS: 50000.0000 [IU] | ORAL_CAPSULE | ORAL | 0 refills | Status: DC

## 2023-04-17 MED ORDER — FOLIC ACID 1 MG PO TABS: 1.0000 mg | ORAL_TABLET | Freq: Every day | ORAL | 2 refills | Status: DC

## 2023-04-17 NOTE — Progress Notes (Unsigned)
SUBJECTIVE:  Chief Complaint: Obesity  Interim History: [name] is up 2 lb since her last visit.  Discussed the use of AI scribe software for clinical note transcription with the patient, who gave verbal consent to proceed.  History of Present Illness     Robin Mills, a 72 year old patient with obesity, has been following a treatment plan that includes Wegovy 2.4 mg weekly, along with vitamin D, vitamin B12, and folic acid supplementation. The patient reports a slight weight gain, which she attributes to increased consumption of eggnog and Christmas cookies during the holiday season. She is aware of the source of the weight gain and is not surprised by it.  The patient has been trying to increase her physical activity, including going to the gym. However, she recently started a new job and has been out of town, which has disrupted her regular exercise routine. She expects to have a regular schedule after about a month, which she hopes will allow her to be more consistent with her physical activity.  The patient has noticed some hair thinning since starting [facility], which she believes may be a side effect of the medication. She also reports a constantly running nose, which she suspects might be related to her use of a CPAP machine for sleep apnea. The patient is considering trying a supplement called Nutrafol to address the hair thinning and is considering taking an over-the-counter antihistamine to address the runny nose.  The patient is also considering using meal delivery services to help manage her diet, given her new job and the associated schedule changes. She is considering several options, including [employer] and another company recommended by the provider. She is planning to compare prices and convenience factors before making a decision.  The patient is committed to continuing her obesity treatment plan and is not overly concerned about the recent slight weight gain. She is giving  herself about four weeks to settle into her new job and build up her stamina before reassessing her progress and making any necessary adjustments to her treatment plan.  Rob[facility] is here to discuss her progress with her obesity treatment plan. She is on the Category 2 Plan and states she is following her eating plan approximately 70 % of the time. She states she is exercising, walking 30 minutes 2 times per week.   OBJECTIVE: Visit Diagnoses: Problem List Items Addressed This Visit     Polyphagia - Primary   BMI 30.0-30.9,adult   Relevant Medications   Semaglutide-Weight Management 2.4 MG/0.75ML SOAJ   Generalized obesity   Relevant Medications   Semaglutide-Weight Management 2.4 MG/0.75ML SOAJ   Folate deficiency   Relevant Medications   folic acid (FOLVITE) 1 MG tablet   B12 deficiency   Relevant Medications   cyanocobalamin (VITAMIN B12) 1000 MCG tablet   Vitamin D deficiency   Relevant Medications   Vitamin D, Ergocalciferol, (DRISDOL) 1.25 MG (50000 UNIT) CAPS capsule   Other Visit Diagnoses       Obesity, starting BMI 31.17       Relevant Medications   Semaglutide-Weight Management 2.4 MG/0.75ML SOAJ     Obesity with polyphagia 72 year old on Wegovy 2.4 mg weekly. Slight weight gain attributed to dietary indulgences and reduced physical activity due to new job. Discussed [employer] milk for high-protein, low-calorie intake and [employer] for convenience and meal planning. - Refill [facility] [place] 2.4 mg weekly - Recommend [employer] milk for protein needs and hunger management - Recommend [employer] for convenience and meal planning -  Follow-up appointments on January 8th at 7 AM, February 5th at 12 PM, and March 5th at 7:30 AM with fasting labs on March 5th  Hair Thinning Reports hair thinning since starting Wegovy. Recommended Nutrafol supplement for 3-6 months. Discussed potential benefits and personal experience with Nutrafol. - Recommend Nutrafol supplement for  3-6 months  Vitamin D Deficiency Currently on vitamin D supplementation, needs refill. - Refill vitamin D supplementation- ergocalciferol 50,000 units once weekly.  Low vitamin D levels can be associated with adiposity and may result in leptin resistance and weight gain. Also associated with fatigue. Currently on vitamin D supplementation without any adverse effects.    Vitamin B12 Deficiency Currently on vitamin B12 supplementation, refill as needed. - Refill vitamin B12  1000 mcg daily supplementation if needed  Folic Acid Deficiency Currently on folic acid supplementation, refill as needed. - Refill folic acid supplementation if needed  Rhinorrhea Reports constant rhinorrhea, possibly related to allergies or CPAP use. No other significant symptoms. Recommended over-the-counter Zyrtec at bedtime. - Recommend over-the-counter Zyrtec at bedtime  General Health Maintenance Discussed dietary modifications, importance of protein intake, and strategies to manage dietary indulgences during the holiday season. - Encourage [employer]life milk for additional protein intake - Discuss strategies to manage dietary indulgences during the holiday season  Follow-up - Follow-up appointment on January 8th at 7 AM - Follow-up appointment on February 5th at 12 PM - Follow-up appointment on March 5th at 7:30 AM with fasting labs.  Vitals Temp: 97.8 F (36.6 C) BP: 124/73 Pulse Rate: 74 SpO2: 98 %   Anthropometric Measurements Height: 5\' 7"  (1.702 m) Weight: 194 lb (88 kg) BMI (Calculated): 30.38 Weight at Last Visit: 192 lb Weight Lost Since Last Visit: 0 Weight Gained Since Last Visit: 2 lb Starting Weight: 199 lb Total Weight Loss (lbs): 5 lb (2.268 kg) Peak Weight: 221 lb   Body Composition  Body Fat %: 41.3 % Fat Mass (lbs): 80.4 lbs Muscle Mass (lbs): 108.4 lbs Total Body Water (lbs): 76 lbs Visceral Fat Rating : 12   Other Clinical Data Fasting: no Labs: no Today's Visit #:  8 Starting Date: 11/15/22     ASSESSMENT AND PLAN:  Diet: Robin Mills is currently in the action stage of change. As such, her goal is to continue with weight loss efforts. She has agreed to Category 2 Plan.  Exercise: Martesha has been instructed to work up to a goal of 150 minutes of combined cardio and strengthening exercise per week for weight loss and overall health benefits.   Behavior Modification:  We discussed the following Behavioral Modification Strategies today: increasing lean protein intake, decreasing simple carbohydrates, increasing vegetables, increase H2O intake, increase high fiber foods, no skipping meals, meal planning and cooking strategies, keeping healthy foods in the home, holiday eating strategies, and planning for success. We discussed various medication options to help Deriona with her weight loss efforts and we both agreed to continue Wegovy 2.4 mg weekly.  Return in about 22 days (around 05/09/2023).Marland Kitchen She was informed of the importance of frequent follow up visits to maximize her success with intensive lifestyle modifications for her multiple health conditions.  Attestation Statements:   Reviewed by clinician on day of visit: allergies, medications, problem list, medical history, surgical history, family history, social history, and previous encounter notes.   Time spent on visit including pre-visit chart review and post-visit care and charting was 33 minutes.    Tyvon Eggenberger, PA-C

## 2023-05-03 ENCOUNTER — Ambulatory Visit (INDEPENDENT_AMBULATORY_CARE_PROVIDER_SITE_OTHER): Payer: Medicare PPO | Admitting: Family Medicine

## 2023-05-07 DIAGNOSIS — Z1231 Encounter for screening mammogram for malignant neoplasm of breast: Secondary | ICD-10-CM | POA: Diagnosis not present

## 2023-05-07 DIAGNOSIS — Z01419 Encounter for gynecological examination (general) (routine) without abnormal findings: Secondary | ICD-10-CM | POA: Diagnosis not present

## 2023-05-07 DIAGNOSIS — Z1331 Encounter for screening for depression: Secondary | ICD-10-CM | POA: Diagnosis not present

## 2023-05-08 NOTE — Progress Notes (Signed)
 SUBJECTIVE:  Chief Complaint: Obesity  Interim History: She has maintained her weight since her last visit.  Bio impedence scale reviewed with the patient:  Muscle mass + 4 lbs. Adipose mass - 4.4 lbs Total body water - 2.6 lbs Down 5 lbs overall TBW loss of 2.5% since 11/15/22 Down 27 lbs from peak weight of 221 lbs- TBW loss of 12.2% from peak weight !!  Robin Mills is a 73 year old female with a history of vitamin D  and B12 deficiencies, presents for a follow-up of her obesity treatment plan. She has been on Wegovy  2.4 mg weekly. She is continuing on vitamin D  and B 12/folate supplements. She reports a positive experience with a new meal plan- Factor, finding the meals tasty and well-seasoned. She has been maintaining an active lifestyle, planning to return to the gym in February.  She reports a recent mammogram with no significant findings. She has noticed a weight reduction from 227 pounds to 194 pounds, with an increase in muscle mass and a decrease in adipose tissue. Despite the positive changes, she expresses a desire to lose an additional 15-20 pounds, acknowledging the need for a gradual approach.  She has been experiencing some fatigue, which she attributes possibly to her B12 deficiency. She has been taking a daily B12 supplement, and we discussed considering switching to a sublingual form for better absorption after discussion today. .  She also mentions her new job, which has been positive but demanding.   Robin Mills is here to discuss her progress with her obesity treatment plan. She is on the Category 2 Plan and states she is following her eating plan approximately 60 % of the time. She states she is exercising walking 30 minutes 2 times per week.   OBJECTIVE: Visit Diagnoses: Problem List Items Addressed This Visit     Polyphagia - Primary   BMI 30.0-30.9,adult   B12 deficiency   Vitamin D  deficiency   Relevant Medications   Vitamin D , Ergocalciferol , (DRISDOL )  1.25 MG (50000 UNIT) CAPS capsule   Other Visit Diagnoses       Obesity, starting BMI 31.17         Obesity with polyphagia.  73 year old with obesity, currently on Wegovy  2.4mg  weekly. Significant progress noted: body adipose percentage reduced to 39.1%, BMI down to 30. Goal: reduce body adipose percentage to <35%. Discussed gradual weight loss in 5-10 pound increments to maintain reserves. Patient aims to lose 15-20 more pounds but agreed to incremental approach.Polyphagia well controlled on current regimen.  - Continue/refill Wegovy  2.4 mg weekly - Encourage healthy eating habits with adequate protein and overall calories based on Cat. 2 plan - Promote gradual weight loss in 5-10 pound increments - Follow-up visit on February 5th at 12 noon  Vitamin D  Deficiency Vitamin D  is not at goal of 50.  Most recent vitamin D  level was 49- very near goal of 50-70. She is on  prescription ergocalciferol  50,000 IU weekly. No N/V or muscle weakness with Ergocalciferol . Notes still feels more fatigued than she feels she should at times.  Lab Results  Component Value Date   VD25OH 49.0 02/01/2023   VD25OH 27.4 (L) 11/15/2022    Plan: Continue and refill  prescription ergocalciferol  50,000 IU weekly Low vitamin D  levels can be associated with adiposity and may result in leptin resistance and weight gain. Also associated with fatigue. Currently on vitamin D  supplementation without any adverse effects.  Recheck vitamin D  levels several times yearly to optimize supplementation/avoid over supplementation.  Vitamin B12 Deficiency Currently on 1000 mcg daily. Reports fatigue and interested in checking B12 levels. Discussed potential benefit of sublingual B12 for better absorption. If levels do not improve, may need B12 injections. - Consider switching to sublingual B12 supplement (500 mcg, take two if needed) - Fasting labs at March visit to check B12 levels as well as other labs.  General Health  Maintenance Recent mammogram with positive feedback from OB/GYN. Plans to return to gym and mindful of physical activity. - Encourage regular physical activity - Monitor overall health and wellness  Follow-up - Follow-up visit on February 5th at 12 noon - Fasting labs at visit scheduled for March in am.   Vitals Temp: 98.7 F (37.1 C) BP: 130/75 Pulse Rate: 91 SpO2: 96 %   Anthropometric Measurements Height: 5' 7 (1.702 m) Weight: 194 lb (88 kg) BMI (Calculated): 30.38 Weight at Last Visit: 194 lb Weight Lost Since Last Visit: 0 Weight Gained Since Last Visit: 0 Starting Weight: 5 lb Peak Weight: 221 lb   Body Composition  Body Fat %: 39.1 % Fat Mass (lbs): 76 lbs Muscle Mass (lbs): 112.4 lbs Total Body Water (lbs): 73.4 lbs Visceral Fat Rating : 11   Other Clinical Data Fasting: no Labs: no Today's Visit #: 9 Starting Date: 11/15/22     ASSESSMENT AND PLAN:  Diet: Shakeera is currently in the action stage of change. As such, her goal is to continue with weight loss efforts. She has agreed to Category 2 Plan.  Exercise: Evarose has been instructed to work up to a goal of 150 minutes of combined cardio and strengthening exercise per week for weight loss and overall health benefits.   Behavior Modification:  We discussed the following Behavioral Modification Strategies today: increasing lean protein intake, decreasing simple carbohydrates, increasing vegetables, increase H2O intake, increase high fiber foods, no skipping meals, meal planning and cooking strategies, avoiding temptations, and planning for success. We discussed various medication options to help Brady with her weight loss efforts and we both agreed to continue Wegovy  2.4 mg weekly for medical weight loss.  Return in about 4 weeks (around 06/06/2023).SABRA She was informed of the importance of frequent follow up visits to maximize her success with intensive lifestyle modifications for her multiple  health conditions.  Attestation Statements:   Reviewed by clinician on day of visit: allergies, medications, problem list, medical history, surgical history, family history, social history, and previous encounter notes.   Time spent on visit including pre-visit chart review and post-visit care and charting was 35      minutes.    Wilmarie Sparlin, PA-C

## 2023-05-09 ENCOUNTER — Ambulatory Visit (INDEPENDENT_AMBULATORY_CARE_PROVIDER_SITE_OTHER): Payer: Medicare PPO | Admitting: Physician Assistant

## 2023-05-09 ENCOUNTER — Encounter (INDEPENDENT_AMBULATORY_CARE_PROVIDER_SITE_OTHER): Payer: Self-pay | Admitting: Physician Assistant

## 2023-05-09 VITALS — BP 130/75 | HR 91 | Temp 98.7°F | Ht 67.0 in | Wt 194.0 lb

## 2023-05-09 DIAGNOSIS — R632 Polyphagia: Secondary | ICD-10-CM | POA: Diagnosis not present

## 2023-05-09 DIAGNOSIS — Z6831 Body mass index (BMI) 31.0-31.9, adult: Secondary | ICD-10-CM

## 2023-05-09 DIAGNOSIS — E538 Deficiency of other specified B group vitamins: Secondary | ICD-10-CM | POA: Diagnosis not present

## 2023-05-09 DIAGNOSIS — E559 Vitamin D deficiency, unspecified: Secondary | ICD-10-CM

## 2023-05-09 DIAGNOSIS — E669 Obesity, unspecified: Secondary | ICD-10-CM | POA: Diagnosis not present

## 2023-05-09 DIAGNOSIS — Z6830 Body mass index (BMI) 30.0-30.9, adult: Secondary | ICD-10-CM | POA: Diagnosis not present

## 2023-05-09 MED ORDER — VITAMIN D (ERGOCALCIFEROL) 1.25 MG (50000 UNIT) PO CAPS: 50000.0000 [IU] | ORAL_CAPSULE | ORAL | 0 refills | Status: DC

## 2023-06-06 ENCOUNTER — Ambulatory Visit (INDEPENDENT_AMBULATORY_CARE_PROVIDER_SITE_OTHER): Payer: Medicare PPO | Admitting: Physician Assistant

## 2023-06-06 NOTE — Progress Notes (Deleted)
   SUBJECTIVE:  Chief Complaint: Obesity  Interim History: ***  Hartlyn is here to discuss her progress with her obesity treatment plan. She is on the {HWW Weight Loss Plan:210964005} and states she {CHL AMB IS/IS NOT:210130109} following her eating plan approximately *** % of the time. She states she {CHL AMB IS/IS NOT:210130109} exercising *** minutes *** times per week.   OBJECTIVE: Visit Diagnoses: Problem List Items Addressed This Visit     Class 1 obesity with serious comorbidity and body mass index (BMI) of 30.0 to 30.9 in adult   Polyphagia - Primary   OSA (obstructive sleep apnea)   B12 deficiency   Vitamin D  deficiency    No data recorded No data recorded No data recorded No data recorded   ASSESSMENT AND PLAN:  Diet: [place] {CHL AMB IS/IS NOT:210130109} currently in the action stage of change. As such, her goal is to {HWW Weight Loss Efforts:210964006}. She {HAS HAS WNU:81165} agreed to {HWW Weight Loss Plan:210964005}.  Exercise: Zaliah has been instructed {HWW Exercise:210964007} for weight loss and overall health benefits.   Behavior Modification:  We discussed the following Behavioral Modification Strategies today: {HWW Behavior Modification:210964008}. We discussed various medication options to help Braiden with her weight loss efforts and we both agreed to ***.  No follow-ups on file.SABRA She was informed of the importance of frequent follow up visits to maximize her success with intensive lifestyle modifications for her multiple health conditions.  Attestation Statements:   Reviewed by clinician on day of visit: allergies, medications, problem list, medical history, surgical history, family history, social history, and previous encounter notes.   Time spent on visit including pre-visit chart review and post-visit care and charting was *** minutes.    Anuhea Gassner, PA-C

## 2023-07-04 ENCOUNTER — Ambulatory Visit (INDEPENDENT_AMBULATORY_CARE_PROVIDER_SITE_OTHER): Payer: Medicare PPO | Admitting: Physician Assistant

## 2023-07-04 ENCOUNTER — Encounter (INDEPENDENT_AMBULATORY_CARE_PROVIDER_SITE_OTHER): Payer: Self-pay | Admitting: Physician Assistant

## 2023-07-04 VITALS — BP 101/67 | HR 93 | Temp 98.0°F | Ht 67.0 in | Wt 197.0 lb

## 2023-07-04 DIAGNOSIS — E538 Deficiency of other specified B group vitamins: Secondary | ICD-10-CM

## 2023-07-04 DIAGNOSIS — R632 Polyphagia: Secondary | ICD-10-CM | POA: Diagnosis not present

## 2023-07-04 DIAGNOSIS — Z6831 Body mass index (BMI) 31.0-31.9, adult: Secondary | ICD-10-CM

## 2023-07-04 DIAGNOSIS — E669 Obesity, unspecified: Secondary | ICD-10-CM

## 2023-07-04 DIAGNOSIS — E88819 Insulin resistance, unspecified: Secondary | ICD-10-CM

## 2023-07-04 DIAGNOSIS — E559 Vitamin D deficiency, unspecified: Secondary | ICD-10-CM | POA: Diagnosis not present

## 2023-07-04 DIAGNOSIS — Z6830 Body mass index (BMI) 30.0-30.9, adult: Secondary | ICD-10-CM

## 2023-07-04 DIAGNOSIS — E66811 Obesity, class 1: Secondary | ICD-10-CM

## 2023-07-04 MED ORDER — VITAMIN D (ERGOCALCIFEROL) 1.25 MG (50000 UNIT) PO CAPS: 50000.0000 [IU] | ORAL_CAPSULE | ORAL | 0 refills | Status: DC

## 2023-07-04 MED ORDER — FOLIC ACID 1 MG PO TABS: 1.0000 mg | ORAL_TABLET | Freq: Every day | ORAL | 2 refills | Status: DC

## 2023-07-04 MED ORDER — SEMAGLUTIDE-WEIGHT MANAGEMENT 2.4 MG/0.75ML ~~LOC~~ SOAJ: 2.4000 mg | SUBCUTANEOUS | 0 refills | Status: DC

## 2023-07-04 NOTE — Progress Notes (Signed)
 SUBJECTIVE: Discussed the use of AI scribe software for clinical note transcription with the patient, who gave verbal consent to proceed.  Chief Complaint: Obesity  Interim History: She is up 3 lbs since last visit. Muscle mass + 5 lbs Adipose mass +4 lbs.   Robin Mills is here to discuss her progress with her obesity treatment plan. She is on the Category 2 Plan and states she is following her eating plan approximately 50 % of the time. She states she is exercising walking 15 minutes 3 times per week. Robin Mills is a 73 year old female who presents for follow-up of her obesity treatment plan.  She is currently on Wegovy 2.4 mg weekly for medical weight loss. Since her last visit in January, she has increased her muscle mass by five pounds but also gained four pounds of adipose tissue. She attributes the weight gain to increased social commitments and stress, which have led to higher sugar intake and alcohol consumption. Stress has significantly influenced her eating habits, particularly her sugar cravings.  Her work environment is currently stressful as she is still learning new tasks, impacting her focus on health. She has been consuming diet sodas and eating out more frequently, including having desserts and drinks.  She has a history of vitamin D deficiency, B12 deficiency, folate deficiency, and obstructive sleep apnea for which she uses CPAP. Her current medications include B12 1000 mcg daily, folic acid 1 mg daily, and ergocalciferol 50,000 units once weekly. She recently purchased a B12 supplement of 5000 mcg and is considering adjusting the frequency of intake.  She has a history of hypertension and is on Hyzaar 100-25 mg daily. No specific issues or changes were discussed regarding her hypertension management.  Fasting labs were obtained today.  The patient was informed we would discuss the lab results at the next visit unless there is a critical issue that needs to be addressed  sooner. The patient agreed to keep the next visit at the agreed upon time to discuss these results.   OBJECTIVE: Visit Diagnoses: Problem List Items Addressed This Visit     Polyphagia   BMI 30.0-30.9,adult   Relevant Medications   Semaglutide-Weight Management 2.4 MG/0.75ML SOAJ   Folate deficiency   Relevant Medications   folic acid (FOLVITE) 1 MG tablet   Other Relevant Orders   Folate   B12 deficiency   Relevant Orders   Vitamin B12   CBC with Differential/Platelet   Vitamin D deficiency   Relevant Medications   Vitamin D, Ergocalciferol, (DRISDOL) 1.25 MG (50000 UNIT) CAPS capsule   Other Relevant Orders   VITAMIN D 25 Hydroxy (Vit-D Deficiency, Fractures)   Other Visit Diagnoses       Insulin resistance    -  Primary   Relevant Orders   CMP14+EGFR   Hemoglobin A1c   Insulin, random     Obesity, starting BMI 31.17       Relevant Medications   Semaglutide-Weight Management 2.4 MG/0.75ML SOAJ     Obesity with polyphagia She is on Wegovy 2.4 mg weekly for medical weight loss. Recent weight gain is attributed to increased social commitments and stress, leading to higher sugar intake. She has also built muscle mass, which is beneficial for increasing basal metabolic rate. Acknowledges the need to refocus on dietary habits and reduce sugar intake. Plans to utilize a grocery list and dietary plan to aid in meal planning.Polyphagia well controlled when eating on plan.  Refill and continue Wegovy 2.4 mg weekly -  Recheck B12 levels - Perform CBC, kidney function, liver function, and electrolytes - Recheck A1c and insulin levels - Provide a copy of the dietary plan (Category 2) - Provide a grocery list to aid in meal planning - Schedule follow-up in six weeks  Vitamin B12 Deficiency Currently taking 1000 mcg of B12 daily.  No side effects. Feels energy level is good now. She has purchased a 5000 mcg B12 supplement and was advised to adjust the frequency of intake to 1-2  times a week to avoid over supplementation. B12 levels will be rechecked to ensure adequacy. - Adjust B12 intake to 5000 mcg 1-2 times a week at most.  - Recheck B12 levels  Hypertension She is on Hyzaar 100-25 mg daily. No side effects.  BP Readings from Last 3 Encounters:  07/04/23 101/67  05/09/23 130/75  04/17/23 124/73  Continue medication.  Continue to work on nutrition plan to promote weight loss and improve BP control.     Vitamin D Deficiency On ergocalciferol 50,000 units once weekly. No N/V or muscle weakness with Ergo.  Last vitamin D Lab Results  Component Value Date   VD25OH 49.0 02/01/2023   Continue/refill Ergocalciferol 50,000 units weekly.  Low vitamin D levels can be associated with adiposity and may result in leptin resistance and weight gain. Also associated with fatigue.  Currently on vitamin D supplementation without any adverse effects such as nausea, vomiting or muscle weakness.  Recheck vitamin D level today and adjust supplementation accordingly.   Folate Deficiency On folic acid 1 mg daily. No side effect.  Lab Results  Component Value Date   FOLATE >20.0 02/01/2023   Plan: Continue/refill folic acid 1 mg daily.  Recheck folate levels  Follow-up Will return for follow-up in six weeks. The appointment is scheduled for April 21st at 7:30 AM. - Schedule follow-up appointment for April 21st at 7:30 AM  Vitals Temp: 98 F (36.7 C) BP: 101/67 Pulse Rate: 93 SpO2: 97 %   Anthropometric Measurements Height: 5\' 7"  (1.702 m) Weight: 197 lb (89.4 kg) BMI (Calculated): 30.85 Weight at Last Visit: 194 lb Weight Lost Since Last Visit: 0 Weight Gained Since Last Visit: 3 l Starting Weight: 199 lb Total Weight Loss (lbs): 2 lb (0.907 kg) Peak Weight: 221 lb   Body Composition  Body Fat %: 40.5 % Fat Mass (lbs): 80 lbs Muscle Mass (lbs): 117.4 lbs Total Body Water (lbs): 73.6 lbs Visceral Fat Rating : 12   Other Clinical Data Fasting:  yes Labs: yes Today's Visit #: 10 Starting Date: 11/15/22     ASSESSMENT AND PLAN:  Diet: Robin Mills is currently in the action stage of change. As such, her goal is to continue with weight loss efforts and has agreed to the Category 2 Plan.   Exercise:  Older adults should follow the adult guidelines. When older adults cannot meet the adult guidelines, they should be as physically active as their abilities and conditions will allow.  Behavior Modification:  We discussed the following Behavioral Modification Strategies today: increasing lean protein intake, decreasing simple carbohydrates, increasing vegetables, increase H2O intake, decrease liquid calories, increase high fiber foods, decreasing eating out, meal planning and cooking strategies, better snacking choices, avoiding temptations, and planning for success. We discussed various medication options to help Zaniyah with her weight loss efforts and we both agreed to continue wegovy 2.4 mg weekly for medical weight loss.  Return in about 6 weeks (around 08/15/2023).Marland Kitchen She was informed of the importance of frequent follow up  visits to maximize her success with intensive lifestyle modifications for her multiple health conditions.  Attestation Statements:   Reviewed by clinician on day of visit: allergies, medications, problem list, medical history, surgical history, family history, social history, and previous encounter notes.   Time spent on visit including pre-visit chart review and post-visit care and charting was 35 minutes  Honorio Devol,PA-C

## 2023-07-05 LAB — CMP14+EGFR
ALT: 16 IU/L (ref 0–32)
AST: 16 IU/L (ref 0–40)
Albumin: 4.4 g/dL (ref 3.8–4.8)
Alkaline Phosphatase: 86 IU/L (ref 44–121)
BUN/Creatinine Ratio: 15 (ref 12–28)
BUN: 11 mg/dL (ref 8–27)
Bilirubin Total: 0.4 mg/dL (ref 0.0–1.2)
CO2: 20 mmol/L (ref 20–29)
Calcium: 9.6 mg/dL (ref 8.7–10.3)
Chloride: 99 mmol/L (ref 96–106)
Creatinine, Ser: 0.73 mg/dL (ref 0.57–1.00)
Globulin, Total: 2.4 g/dL (ref 1.5–4.5)
Glucose: 98 mg/dL (ref 70–99)
Potassium: 3.7 mmol/L (ref 3.5–5.2)
Sodium: 141 mmol/L (ref 134–144)
Total Protein: 6.8 g/dL (ref 6.0–8.5)
eGFR: 88 mL/min/{1.73_m2} (ref >59)

## 2023-07-05 LAB — HEMOGLOBIN A1C
Est. average glucose Bld gHb Est-mCnc: 111 mg/dL
Hgb A1c MFr Bld: 5.5 % (ref 4.8–5.6)

## 2023-07-05 LAB — FOLATE: Folate: >20 ng/mL (ref >3.0)

## 2023-07-05 LAB — CBC WITH DIFFERENTIAL/PLATELET
Basophils Absolute: 0.1 10*3/uL (ref 0.0–0.2)
Basos: 1 %
EOS (ABSOLUTE): 0.3 10*3/uL (ref 0.0–0.4)
Eos: 4 %
Hematocrit: 40.1 % (ref 34.0–46.6)
Hemoglobin: 13.7 g/dL (ref 11.1–15.9)
Immature Grans (Abs): 0 10*3/uL (ref 0.0–0.1)
Immature Granulocytes: 0 %
Lymphocytes Absolute: 1.7 10*3/uL (ref 0.7–3.1)
Lymphs: 23 %
MCH: 30.6 pg (ref 26.6–33.0)
MCHC: 34.2 g/dL (ref 31.5–35.7)
MCV: 90 fL (ref 79–97)
Monocytes Absolute: 0.6 10*3/uL (ref 0.1–0.9)
Monocytes: 8 %
Neutrophils Absolute: 4.8 10*3/uL (ref 1.4–7.0)
Neutrophils: 64 %
Platelets: 289 10*3/uL (ref 150–450)
RBC: 4.47 x10E6/uL (ref 3.77–5.28)
RDW: 12.5 % (ref 11.7–15.4)
WBC: 7.5 10*3/uL (ref 3.4–10.8)

## 2023-07-05 LAB — INSULIN, RANDOM: INSULIN: 22.6 u[IU]/mL (ref 2.6–24.9)

## 2023-07-05 LAB — VITAMIN D 25 HYDROXY (VIT D DEFICIENCY, FRACTURES): Vit D, 25-Hydroxy: 47.8 ng/mL (ref 30.0–100.0)

## 2023-07-05 LAB — VITAMIN B12: Vitamin B-12: >2000 pg/mL — ABNORMAL HIGH (ref 232–1245)

## 2023-08-10 DIAGNOSIS — G4733 Obstructive sleep apnea (adult) (pediatric): Secondary | ICD-10-CM | POA: Diagnosis not present

## 2023-08-10 DIAGNOSIS — Z79899 Other long term (current) drug therapy: Secondary | ICD-10-CM | POA: Diagnosis not present

## 2023-08-10 DIAGNOSIS — K219 Gastro-esophageal reflux disease without esophagitis: Secondary | ICD-10-CM | POA: Diagnosis not present

## 2023-08-10 DIAGNOSIS — I10 Essential (primary) hypertension: Secondary | ICD-10-CM | POA: Diagnosis not present

## 2023-08-14 ENCOUNTER — Telehealth (INDEPENDENT_AMBULATORY_CARE_PROVIDER_SITE_OTHER): Payer: Self-pay | Admitting: Physician Assistant

## 2023-08-14 DIAGNOSIS — Z6830 Body mass index (BMI) 30.0-30.9, adult: Secondary | ICD-10-CM

## 2023-08-14 NOTE — Telephone Encounter (Signed)
 Good morning!  She says that she doesn't have enough wegovy to get to her to her next appointment. She refuses to come in for an earlier appointment and insists that she gets a call with an update once the gap of her prescription has been addressed.  Thanks!

## 2023-08-14 NOTE — Telephone Encounter (Signed)
 See my chart message

## 2023-08-20 ENCOUNTER — Ambulatory Visit (INDEPENDENT_AMBULATORY_CARE_PROVIDER_SITE_OTHER): Admitting: Physician Assistant

## 2023-08-20 ENCOUNTER — Encounter (INDEPENDENT_AMBULATORY_CARE_PROVIDER_SITE_OTHER): Payer: Self-pay | Admitting: Physician Assistant

## 2023-08-20 VITALS — BP 117/71 | HR 86 | Temp 98.1°F | Ht 67.0 in | Wt 198.0 lb

## 2023-08-20 DIAGNOSIS — E538 Deficiency of other specified B group vitamins: Secondary | ICD-10-CM | POA: Diagnosis not present

## 2023-08-20 DIAGNOSIS — Z6831 Body mass index (BMI) 31.0-31.9, adult: Secondary | ICD-10-CM | POA: Diagnosis not present

## 2023-08-20 DIAGNOSIS — Z6830 Body mass index (BMI) 30.0-30.9, adult: Secondary | ICD-10-CM

## 2023-08-20 DIAGNOSIS — E559 Vitamin D deficiency, unspecified: Secondary | ICD-10-CM

## 2023-08-20 DIAGNOSIS — R632 Polyphagia: Secondary | ICD-10-CM | POA: Diagnosis not present

## 2023-08-20 DIAGNOSIS — E66811 Obesity, class 1: Secondary | ICD-10-CM

## 2023-08-20 DIAGNOSIS — E88819 Insulin resistance, unspecified: Secondary | ICD-10-CM

## 2023-08-20 DIAGNOSIS — E669 Obesity, unspecified: Secondary | ICD-10-CM | POA: Diagnosis not present

## 2023-08-20 MED ORDER — VITAMIN D (ERGOCALCIFEROL) 1.25 MG (50000 UNIT) PO CAPS
50000.0000 [IU] | ORAL_CAPSULE | ORAL | 0 refills | Status: AC
Start: 2023-08-20 — End: ?

## 2023-08-20 MED ORDER — SEMAGLUTIDE-WEIGHT MANAGEMENT 2.4 MG/0.75ML ~~LOC~~ SOAJ: 2.4000 mg | SUBCUTANEOUS | 0 refills | Status: DC

## 2023-08-20 NOTE — Progress Notes (Signed)
 SUBJECTIVE: Discussed the use of AI scribe software for clinical note transcription with the patient, who gave verbal consent to proceed.  Chief Complaint: Obesity  Interim History: She is up 1 lb since her last visit.   Robin Mills is here to discuss her progress with her obesity treatment plan. She is on the Category 2 Plan and states she is not following her eating plan approximately 0 % of the time. She states she is not exercising 0 minutes 0 times per week but walking a great deal at her job- [employer].  Robin Mills is a 73 year old female who presents for follow-up of her obesity treatment plan.  She has been on Wegovy  for medical weight loss without significant side effects, except for occasional constipation, which is not severe. She is currently paying $100 per month for Wegovy  and has one more month of supply left. She is concerned about the potential for increased costs of Wegovy .  She is also taking vitamin D  and B12 supplements due to a history of low levels of these vitamins. She is out of vitamin D  supplements and needs a refill.  Her physical activity includes walking at work, totaling approximately 115 minutes over five days a week. She is not currently following a specific meal plan but intends to return to a 'category two' meal plan. She finds meal preparation challenging due to being busy with moving her mother to a new home.  She had a large Easter lunch with dessert and wine, which she acknowledges is not aligned with her weight management goals. She wants to focus more on meal planning and preparation to maintain her weight loss. OBJECTIVE: Visit Diagnoses: Problem List Items Addressed This Visit     Polyphagia   Relevant Medications   Semaglutide -Weight Management 2.4 MG/0.75ML SOAJ   BMI 30.0-30.9,adult   Relevant Medications   Semaglutide -Weight Management 2.4 MG/0.75ML SOAJ   Folate deficiency   Vitamin D  deficiency   Relevant Medications   Vitamin D ,  Ergocalciferol , (DRISDOL ) 1.25 MG (50000 UNIT) CAPS capsule   Other Visit Diagnoses       Insulin  resistance    -  Primary     Obesity, starting BMI 31.17       Relevant Medications   Semaglutide -Weight Management 2.4 MG/0.75ML SOAJ     Obesity with polyphagia Robin Mills, a 73 year old female, is undergoing treatment for obesity with Wegovy  for medical weight loss. She reports mild constipation as the only side effect. She is concerned about the increased cost of Wegovy , now $100 per month, and is considering Zepbound as an alternative.  Denies mass in neck, dysphagia, dyspepsia, persistent hoarseness, abdominal pain, or N/V/Constipation or diarrhea. Has annual eye exam. Mood is stable.   She plans to resume a 'category two' meal plan and currently engages in 115 minutes of walking per week. Emphasis was placed on meal planning, preparation, increasing water intake, and incorporating more fiber through fruits and vegetables. Alternative weight loss medications, including Zepbound, were discussed. - Refill Wegovy  prescription. - Refill vitamin D  and B12 prescriptions. - Encourage adherence to 'category two' meal plan. - Advise on meal planning and preparation, especially on Sundays. - Encourage increased water intake. - Recommend increasing fiber intake through fruits and vegetables. Meds ordered this encounter  Medications   Vitamin D , Ergocalciferol , (DRISDOL ) 1.25 MG (50000 UNIT) CAPS capsule    Sig: Take 1 capsule (50,000 Units total) by mouth every 7 (seven) days.    Dispense:  5 capsule  Refill:  0   Semaglutide -Weight Management 2.4 MG/0.75ML SOAJ    Sig: Inject 2.4 mg into the skin once a week.    Dispense:  9 mL    Refill:  0   Insulin  Resistance Last fasting insulin  was 22.6- not at goal of <5. A1c was 5.5- at goal. Polyphagia:Yes Medication(s): Wegovy  2.4 mg SQ weekly Denies mass in neck, dysphagia, dyspepsia, persistent hoarseness, abdominal pain, or  N/V/Constipation or diarrhea. Has annual eye exam. Mood is stable.    Lab Results  Component Value Date   HGBA1C 5.5 07/04/2023   HGBA1C 5.2 11/15/2022   Lab Results  Component Value Date   INSULIN  22.6 07/04/2023   INSULIN  17.7 11/15/2022    Plan: Continue and refill Wegovy  2.4 mg SQ weekly Continue working on nutrition plan to decrease simple carbohydrates, increase lean proteins and exercise to promote weight loss, improve glycemic control and prevent progression to Type 2 diabetes.   Vitamin D  Deficiency Robin Mills is out of vitamin D  supplements.- Ergocalciferol  50, 000 units weekly.  Last vitamin D  Lab Results  Component Value Date   VD25OH 47.8 07/04/2023  Goal range of 50-70. Nearly at goal.  Low vitamin D  levels can be associated with adiposity and may result in leptin resistance and weight gain. Also associated with fatigue.  Currently on vitamin D  supplementation without any adverse effects such as nausea, vomiting or muscle weakness.  Her levels were previously low, and she requires continued supplementation. - Refill vitamin D  prescription- Ergocalciferol  50,000 units weekly.  Meds ordered this encounter  Medications   Vitamin D , Ergocalciferol , (DRISDOL ) 1.25 MG (50000 UNIT) CAPS capsule    Sig: Take 1 capsule (50,000 Units total) by mouth every 7 (seven) days.    Dispense:  5 capsule    Refill:  0   Semaglutide -Weight Management 2.4 MG/0.75ML SOAJ    Sig: Inject 2.4 mg into the skin once a week.    Dispense:  9 mL    Refill:  0    Vitamin B12 Deficiency Robin Mills's vitamin B12 levels were previously low, and she is on supplementation. Lab Results  Component Value Date   VITAMINB12 >2000 (H) 07/04/2023  She could decrease her supplement to 2 days weekly now.    Follow-up Robin Mills is scheduled for an annual check-up with Dr. Jolee Naval on Wednesday to discuss her vitamin D  and B12 deficiencies. A follow-up appointment is planned in two months to reassess weight  management and overall health. - Schedule follow-up appointment in two months, around June 6. - Discuss vitamin D  and B12 deficiencies with Dr. Jolee Naval during annual check-up.  Vitals Temp: 98.1 F (36.7 C) BP: 117/71 Pulse Rate: 86 SpO2: 96 %   Anthropometric Measurements Height: 5\' 7"  (1.702 m) Weight: 198 lb (89.8 kg) BMI (Calculated): 31 Weight at Last Visit: 197 lb Weight Lost Since Last Visit: 1 lb Weight Gained Since Last Visit: 0 lb Starting Weight: 199 lb Total Weight Loss (lbs): 0 lb (0 kg) Peak Weight: 221 lb   Body Composition  Body Fat %: 39.8 % Fat Mass (lbs): 78.8 lbs Muscle Mass (lbs): 113.2 lbs Total Body Water (lbs): 74.6 lbs Visceral Fat Rating : 12   Other Clinical Data Fasting: yes Labs: no Today's Visit #: 11 Starting Date: 11/15/22     ASSESSMENT AND PLAN:  Diet: Robin Mills is currently in the action stage of change. As such, her goal is to continue with weight loss efforts. She has agreed to Category 2 Plan.  Exercise: Robin Mills  has been instructed to try a geriatric exercise plan and that some exercise is better than none for weight loss and overall health benefits.   Behavior Modification:  We discussed the following Behavioral Modification Strategies today: increasing lean protein intake, decreasing simple carbohydrates, increasing vegetables, increase H2O intake, increase high fiber foods, no skipping meals, meal planning and cooking strategies, avoiding temptations, and planning for success. We discussed various medication options to help Robin Mills with her weight loss efforts and we both agreed to continue Wegovy  for medical weight loss.  Return in about 8 weeks (around 10/15/2023).Aaron Aas She was informed of the importance of frequent follow up visits to maximize her success with intensive lifestyle modifications for her multiple health conditions.  Attestation Statements:   Reviewed by clinician on day of visit: allergies, medications,  problem list, medical history, surgical history, family history, social history, and previous encounter notes.   Time spent on visit including pre-visit chart review and post-visit care and charting was 25 minutes.    Alajiah Dutkiewicz, PA-C

## 2023-08-22 DIAGNOSIS — R82998 Other abnormal findings in urine: Secondary | ICD-10-CM | POA: Diagnosis not present

## 2023-08-22 DIAGNOSIS — E669 Obesity, unspecified: Secondary | ICD-10-CM | POA: Diagnosis not present

## 2023-08-22 DIAGNOSIS — Z1331 Encounter for screening for depression: Secondary | ICD-10-CM | POA: Diagnosis not present

## 2023-08-22 DIAGNOSIS — E538 Deficiency of other specified B group vitamins: Secondary | ICD-10-CM | POA: Diagnosis not present

## 2023-08-22 DIAGNOSIS — K219 Gastro-esophageal reflux disease without esophagitis: Secondary | ICD-10-CM | POA: Diagnosis not present

## 2023-08-22 DIAGNOSIS — Z0000 Encounter for general adult medical examination without abnormal findings: Secondary | ICD-10-CM | POA: Diagnosis not present

## 2023-08-22 DIAGNOSIS — Z1339 Encounter for screening examination for other mental health and behavioral disorders: Secondary | ICD-10-CM | POA: Diagnosis not present

## 2023-08-22 DIAGNOSIS — G4733 Obstructive sleep apnea (adult) (pediatric): Secondary | ICD-10-CM | POA: Diagnosis not present

## 2023-08-22 DIAGNOSIS — I10 Essential (primary) hypertension: Secondary | ICD-10-CM | POA: Diagnosis not present

## 2023-09-04 ENCOUNTER — Telehealth (INDEPENDENT_AMBULATORY_CARE_PROVIDER_SITE_OTHER): Payer: Self-pay

## 2023-09-04 ENCOUNTER — Encounter (INDEPENDENT_AMBULATORY_CARE_PROVIDER_SITE_OTHER): Payer: Self-pay

## 2023-09-04 DIAGNOSIS — M25472 Effusion, left ankle: Secondary | ICD-10-CM | POA: Insufficient documentation

## 2023-09-04 DIAGNOSIS — R232 Flushing: Secondary | ICD-10-CM | POA: Insufficient documentation

## 2023-09-04 DIAGNOSIS — W57XXXA Bitten or stung by nonvenomous insect and other nonvenomous arthropods, initial encounter: Secondary | ICD-10-CM | POA: Insufficient documentation

## 2023-09-04 DIAGNOSIS — L089 Local infection of the skin and subcutaneous tissue, unspecified: Secondary | ICD-10-CM | POA: Insufficient documentation

## 2023-09-04 DIAGNOSIS — R040 Epistaxis: Secondary | ICD-10-CM | POA: Insufficient documentation

## 2023-09-04 DIAGNOSIS — R0609 Other forms of dyspnea: Secondary | ICD-10-CM | POA: Insufficient documentation

## 2023-09-04 DIAGNOSIS — T07XXXA Unspecified multiple injuries, initial encounter: Secondary | ICD-10-CM | POA: Insufficient documentation

## 2023-09-04 DIAGNOSIS — M25579 Pain in unspecified ankle and joints of unspecified foot: Secondary | ICD-10-CM | POA: Insufficient documentation

## 2023-09-04 DIAGNOSIS — M7071 Other bursitis of hip, right hip: Secondary | ICD-10-CM | POA: Insufficient documentation

## 2023-09-04 NOTE — Telephone Encounter (Signed)
 Call from Laurene from [facility].  Needed additional information for prior authorization.  Additional information provided.  Awaiting a determination within 24 hours.

## 2023-09-04 NOTE — Telephone Encounter (Signed)
 PA for Wegovy  2.4 MG has been approved until 04/30/2024. The approval fax was received on 09/04/23

## 2023-10-14 NOTE — Progress Notes (Unsigned)
 SUBJECTIVE: Discussed the use of AI scribe software for clinical note transcription with the patient, who gave verbal consent to proceed.  Chief Complaint: Obesity  Interim History: She is up 6 pounds since her last visit  She has been struggling with the past month due to work obligations, and not making it consistently to the gym so well a broken refrigerator over the past 3 weeks which has caused her to eat out frequently due to no ability to refrigerate food.   Robin Mills is here to discuss her progress with her obesity treatment plan. She is on the Category 2 Plan and states she is following her eating plan approximately 20 % of the time. She states she is walking for exercise 35 minutes 3 times per week. Robin Mills is a 73 year old female who presents for follow-up of her obesity treatment plan.  She has experienced recent weight gain, which she attributes to increased stress and a lack of time for self-care, including not attending the gym. She has resigned from her job due to stress and plans to return to the gym three days a week after her last day on July 25th. She is currently on Wegovy  2.4 mg weekly.Denies mass in neck, dysphagia, dyspepsia, persistent hoarseness, abdominal pain, or N/V/Constipation or diarrhea. Has annual eye exam. Mood is stable.    She has a history of vitamin D  deficiency, B12, and folate deficiencies. She is on B12 and folate supplementation and takes ergocalciferol  50,000 units once weekly. Her vitamin D  and B12 levels were discussed today.  She experiences hot flashes during the day, lasting about two to three minutes, but has no night sweats.  This is something for her.  We discussed following this and if it persist may need further workup.  She just saw her PCP in April and may need to follow up if does not improve over the next few months.  She mentions a past surgery for a lipoma on her left ankle joint, which has since recurred.  She is going to make  an appointment to see Dr. Rosebud Confer in follow-up.  Her brother had leiomyosarcoma and passed away, which prompted her to have the initial surgery, but she worries her night sweats may not just be weight or menopausal and we discussed follow up if not improving.  OBJECTIVE: Visit Diagnoses: Problem List Items Addressed This Visit     Polyphagia - Primary   Relevant Medications   Semaglutide -Weight Management 2.4 MG/0.75ML SOAJ   BMI 30.0-30.9,adult   Relevant Medications   Semaglutide -Weight Management 2.4 MG/0.75ML SOAJ   Folate deficiency   Relevant Medications   folic acid  (FOLVITE ) 1 MG tablet   B12 deficiency   Relevant Medications   cyanocobalamin  (VITAMIN B12) 1000 MCG tablet   Vitamin D  deficiency   Relevant Medications   Vitamin D , Ergocalciferol , (DRISDOL ) 1.25 MG (50000 UNIT) CAPS capsule   Hot flashes   Obesity   Relevant Medications   Semaglutide -Weight Management 2.4 MG/0.75ML SOAJ   Other Visit Diagnoses       Insulin  resistance         Lipoma of foot         BMI 32.0-32.9,adult current BMI 32.0         Obesity Robin Mills, a 73 year old female, is managing obesity with Wegovy  2.5, 4 mg weekly. She reports weight gain due to increased stress and decreased physical activity from personal and family responsibilities. She plans to resume gym activities and dietary management post-resignation. Meal  prep services like [employer] are recommended for dietary assistance. - Refill Wegovy  prescription - Encourage resumption of gym activities three times a week - Recommend dietary management with meal prep services such as [employer] Meds ordered this encounter  Medications   Vitamin D , Ergocalciferol , (DRISDOL ) 1.25 MG (50000 UNIT) CAPS capsule    Sig: Take 1 capsule (50,000 Units total) by mouth every 7 (seven) days.    Dispense:  5 capsule    Refill:  0   cyanocobalamin  (VITAMIN B12) 1000 MCG tablet    Sig: Take 1 tablet (1,000 mcg total) by mouth  2 (two) times a week.    Dispense:  30 tablet    Refill:  0   folic acid  (FOLVITE ) 1 MG tablet    Sig: Take 1 tablet (1 mg total) by mouth daily.    Dispense:  30 tablet    Refill:  2   Semaglutide -Weight Management 2.4 MG/0.75ML SOAJ    Sig: Inject 2.4 mg into the skin once a week.    Dispense:  9 mL    Refill:  0    Insulin  Resistance Robin Mills's insulin  resistance is managed with Wegovy  and nutrition and exercise, which may improve insulin  sensitivity. Her insulin  levels are slightly elevated but not significantly concerning. Continued monitoring is planned. Lab Results  Component Value Date   HGBA1C 5.5 07/04/2023   HGBA1C 5.2 11/15/2022   Lab Results  Component Value Date   LDLCALC 113 (H) 11/15/2022   CREATININE 0.73 07/04/2023   INSULIN   Date Value Ref Range Status  07/04/2023 22.6 2.6 - 24.9 uIU/mL Final  ]Continue working on nutrition plan to decrease simple carbohydrates, increase lean proteins and exercise to promote weight loss, improve glycemic control and prevent progression to Type 2 diabetes.  - Monitor insulin  levels and A1c periodically. -Continue Wegovy  2.4 mg weekly.   Vitamin D  Deficiency Robin Mills's vitamin D  level is 47.8, below the target range of 50-70. She is on ergocalciferol  50,000 units weekly to support bone health, especially postmenopausally. - Continue/refill ergocalciferol  50,000 units once weekly -Low vitamin D  levels can be associated with adiposity and may result in leptin resistance and weight gain. Also associated with fatigue.  Currently on vitamin D  supplementation without any adverse effects such as nausea, vomiting or muscle weakness.   Vitamin B12 Deficiency Robin Mills's vitamin B12 levels have improved significantly with supplementation. Lab Results  Component Value Date   VITAMINB12 >2000 (H) 07/04/2023    She can reduce her B12 supplementation to 1000 micrograms twice weekly. - Reduce B12 supplementation to 1000 micrograms twice  weekly  Folate Deficiency Robin Mills is managing folate deficiency with supplementation. 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 Pathology confirmed a benign lipoma. Due to her family history of leiomyosarcoma, she is advised to consult Dr. Rosebud Confer at [facility] for further evaluation. - Refer to Dr. Rosebud Confer at [facility] for evaluation of recurrent lipoma  Hot Flashes Robin Mills experiences daytime hot flashes lasting 2-3 minutes, without night sweats. The etiology is unclear, and further evaluation may be needed if symptoms persist. Current lab results do not indicate serious conditions. - Monitor hot flashes and consider further evaluation if symptoms persist  General Health Maintenance Robin Mills's health maintenance includes monitoring vitamin levels and weight management. As she is postmenopausal, a protein-focused diet is  encouraged for bone health. - Continue monitoring vitamin D  and B12 levels - Encourage protein-focused diet for bone health -If not improved over the next 3-4 months, may need further evaluation with PCP.   Follow-up Robin Mills plans to resign by July 25th to focus on her health. A follow-up appointment is scheduled to reassess her progress. - Schedule follow-up appointment on August 12th at 11:30 AM  Vitals Temp: 97.9 F (36.6 C) BP: 117/65 Pulse Rate: 80 SpO2: 100 %   Anthropometric Measurements Height: 5' 7 (1.702 m) Weight: 204 lb (92.5 kg) BMI (Calculated): 31.94 Weight at Last Visit: 198 lb Weight Lost Since Last Visit: 0 Weight Gained Since Last Visit: 6 lb Starting Weight: 199 lb Total Weight Loss (lbs): 0 lb (0 kg)   Body Composition  Body Fat %: 41.1 % Fat Mass (lbs): 83.8 lbs Muscle Mass (lbs): 114.2 lbs Total Body Water (lbs): 75 lbs Visceral Fat Rating :  12   Other Clinical Data Fasting: No Labs: No Today's Visit #: 12 Starting Date: 11/15/22     ASSESSMENT AND PLAN:  Diet: Robin Mills is currently in the action stage of change. As such, her goal is to get back to weight loss efforts. She has agreed to Category 2 Plan.  Exercise: Robin Mills has been instructed to work up to a goal of 150 minutes of combined cardio and strengthening exercise per week for weight loss and overall health benefits.   Behavior Modification:  We discussed the following Behavioral Modification Strategies today: increasing lean protein intake, decreasing simple carbohydrates, increasing vegetables, increase H2O intake, increase high fiber foods, decreasing eating out, no skipping meals, meal planning and cooking strategies, avoiding temptations, and planning for success. We discussed various medication options to help Robin Mills with her weight loss efforts and we both agreed to continue current treatment plan, continue to work on nutritional and behavioral strategies to promote weight loss.  .  Return in about 8 weeks (around 12/10/2023).Aaron Aas She was informed of the importance of frequent follow up visits to maximize her success with intensive lifestyle modifications for her multiple health conditions.  Attestation Statements:   Reviewed by clinician on day of visit: allergies, medications, problem list, medical history, surgical history, family history, social history, and previous encounter notes.   Time spent on visit including pre-visit chart review and post-visit care and charting was 37 minutes.    Marianna Cid, PA-C

## 2023-10-15 ENCOUNTER — Encounter (INDEPENDENT_AMBULATORY_CARE_PROVIDER_SITE_OTHER): Payer: Self-pay | Admitting: Physician Assistant

## 2023-10-15 ENCOUNTER — Ambulatory Visit (INDEPENDENT_AMBULATORY_CARE_PROVIDER_SITE_OTHER): Admitting: Physician Assistant

## 2023-10-15 VITALS — BP 117/65 | HR 80 | Temp 97.9°F | Ht 67.0 in | Wt 204.0 lb

## 2023-10-15 DIAGNOSIS — R232 Flushing: Secondary | ICD-10-CM | POA: Diagnosis not present

## 2023-10-15 DIAGNOSIS — Z6832 Body mass index (BMI) 32.0-32.9, adult: Secondary | ICD-10-CM

## 2023-10-15 DIAGNOSIS — E669 Obesity, unspecified: Secondary | ICD-10-CM

## 2023-10-15 DIAGNOSIS — Z6830 Body mass index (BMI) 30.0-30.9, adult: Secondary | ICD-10-CM

## 2023-10-15 DIAGNOSIS — E559 Vitamin D deficiency, unspecified: Secondary | ICD-10-CM | POA: Diagnosis not present

## 2023-10-15 DIAGNOSIS — D1739 Benign lipomatous neoplasm of skin and subcutaneous tissue of other sites: Secondary | ICD-10-CM | POA: Diagnosis not present

## 2023-10-15 DIAGNOSIS — R632 Polyphagia: Secondary | ICD-10-CM | POA: Diagnosis not present

## 2023-10-15 DIAGNOSIS — E88819 Insulin resistance, unspecified: Secondary | ICD-10-CM

## 2023-10-15 DIAGNOSIS — Z6831 Body mass index (BMI) 31.0-31.9, adult: Secondary | ICD-10-CM

## 2023-10-15 DIAGNOSIS — E538 Deficiency of other specified B group vitamins: Secondary | ICD-10-CM | POA: Diagnosis not present

## 2023-10-15 DIAGNOSIS — D1720 Benign lipomatous neoplasm of skin and subcutaneous tissue of unspecified limb: Secondary | ICD-10-CM

## 2023-10-15 MED ORDER — SEMAGLUTIDE-WEIGHT MANAGEMENT 2.4 MG/0.75ML ~~LOC~~ SOAJ
2.4000 mg | SUBCUTANEOUS | 0 refills | Status: DC
Start: 2023-10-15 — End: 2023-12-11

## 2023-10-15 MED ORDER — VITAMIN D (ERGOCALCIFEROL) 1.25 MG (50000 UNIT) PO CAPS
50000.0000 [IU] | ORAL_CAPSULE | ORAL | 0 refills | Status: DC
Start: 2023-10-15 — End: 2023-12-11

## 2023-10-15 MED ORDER — VITAMIN B-12 1000 MCG PO TABS: 1000.0000 ug | ORAL_TABLET | ORAL | 0 refills | Status: DC

## 2023-10-15 MED ORDER — FOLIC ACID 1 MG PO TABS: 1.0000 mg | ORAL_TABLET | Freq: Every day | ORAL | 2 refills | Status: DC

## 2023-12-05 ENCOUNTER — Other Ambulatory Visit (INDEPENDENT_AMBULATORY_CARE_PROVIDER_SITE_OTHER): Payer: Self-pay | Admitting: Physician Assistant

## 2023-12-05 DIAGNOSIS — E559 Vitamin D deficiency, unspecified: Secondary | ICD-10-CM

## 2023-12-10 NOTE — Progress Notes (Signed)
 SUBJECTIVE: Discussed the use of AI scribe software for clinical note transcription with the patient, who gave verbal consent to proceed.  Chief Complaint: Obesity  Interim History: She is up 2 lbs since her last visit.   Robin Mills is here to discuss her progress with her obesity treatment plan. She is on the Category 2 Plan and states she is following her eating plan approximately 50 % of the time. She states she is exercising 30 minutes 2 times per week.  Robin Mills is a 73 year old female who presents for follow-up of her obesity treatment plan.  She has gained two pounds since her last visit and is on a category two plan, adhering to it approximately fifty percent of the time.  She is attempting to eat more whole foods and feels she is getting the recommended amount of protein but acknowledges not drinking enough water.  She occasionally skips meals. She exercises for thirty minutes twice a week and aims to walk at least ten thousand steps daily. She is currently on 2.4 mg of Wegovy  weekly.  She recently quit her job, which she believes has reduced her stress levels. She returned from a two-week trip to [place], where she indulged in some fried foods and alcohol .   She is starting a new part-time job next week, working three days a week in four-hour shifts, which she anticipates will contribute to a less stressful lifestyle.  She reports knee and hip pain, attributing it to her weight. She has a history of polyphagia, insulin  resistance, vitamin D  deficiency, B12 and folate deficiency, and has experienced hot flashes. She occasionally experiences dizziness upon standing, which she associates with low fluid intake or possibly low B12 levels. Her last B12 level was over two thousand, and she takes an over-the-counter B12 supplement sublingually.  She sleeps at least seven hours nightly and describes her energy levels as average. She has experienced hot flashes in the past, which have  decreased in frequency since leaving her stressful job. She occasionally still experiences them.  Fasting labs next OV  OBJECTIVE: Visit Diagnoses: Problem List Items Addressed This Visit     Polyphagia - Primary   Relevant Medications   semaglutide -weight management (WEGOVY ) 2.4 MG/0.75ML SOAJ SQ injection   BMI 30.0-30.9,adult   Relevant Medications   semaglutide -weight management (WEGOVY ) 2.4 MG/0.75ML SOAJ SQ injection   Folate deficiency   Relevant Medications   folic acid  (FOLVITE ) 1 MG tablet   B12 deficiency   Relevant Medications   cyanocobalamin  (VITAMIN B12) 1000 MCG tablet (Start on 12/13/2023)   Vitamin D  deficiency   Relevant Medications   Vitamin D , Ergocalciferol , (DRISDOL ) 1.25 MG (50000 UNIT) CAPS capsule   Hot flashes   Obesity   Relevant Medications   semaglutide -weight management (WEGOVY ) 2.4 MG/0.75ML SOAJ SQ injection   Other Visit Diagnoses       Insulin  resistance         Obesity with polyphagia and insulin  resistance Weight increased by two pounds since last visit. Adheres to category two plan approximately 50% of the time, with occasional meal skipping and insufficient hydration. Engages in exercise twice weekly, aiming for 10,000 steps daily.  Currently on 2.4 mg weekly of Wegovy .  Denies mass in neck, dysphagia, dyspepsia, persistent hoarseness, abdominal pain, or N/V/Constipation or diarrhea. Has annual eye exam. Mood is stable.   Discussed potential reduced efficacy of Wegovy  over time and possibility of switching to a more effective injectable medication if insurance covers it. Emphasized refocusing on  dietary plan and increasing whole food intake. Discussed benefits of maintaining muscle mass and potential cardiovascular benefits of Wegovy , including a 20% reduction in stroke and heart attack risk in type 2 diabetics, with potential similar benefits in non-diabetics. - Continue Wegovy  2.4 mg weekly - Refocus on category two dietary plan -  Increase whole food intake and hydration - Investigate insurance coverage for alternative injectable weight loss medication - Follow up in six weeks to assess progress   Insulin  Resistance Last fasting insulin  was 22.6- not at goal. A1c was 5.5- at goal. Polyphagia:Yes Medication(s): Wegovy  2.4 mg SQ weekly Denies mass in neck, dysphagia, dyspepsia, persistent hoarseness, abdominal pain, or N/V/Constipation or diarrhea. Has annual eye exam. Mood is stable.   Lab Results  Component Value Date   HGBA1C 5.5 07/04/2023   HGBA1C 5.2 11/15/2022   Lab Results  Component Value Date   INSULIN  22.6 07/04/2023   INSULIN  17.7 11/15/2022    Plan: Continue and refill Wegovy  2.4 mg SQ weekly x 90 days Continue working on nutrition plan to decrease simple carbohydrates, increase lean proteins and exercise to promote weight loss, improve glycemic control and prevent progression to Type 2 diabetes.   Arthralgia of knees and hips Knee and hip pain likely related to weight. Discussed potential benefits of weight loss in alleviating joint pain. Plan:  Continue working on nutrition plan to decrease simple carbohydrates, increase lean proteins and exercise to promote weight loss and improve joint pain.   Vitamin D , B12, and folate deficiencies Currently taking over-the-counter B12 sublingual supplements. Last B12 level was over 2000, above recommended level, but without side effects.  Plan: Discussed effectiveness of sublingual B12 supplements and possibility of rechecking labs to assess current levels. - Recheck labs, including vitamin D  and B12 levels, at next visit  Vitamin D  Deficiency Vitamin D  is not at goal of 50.  Most recent vitamin D  level was 47.8. She is on  prescription ergocalciferol  50,000 IU weekly. No N/V or muscle weakness with Ergo.  Lab Results  Component Value Date   VD25OH 47.8 07/04/2023   VD25OH 49.0 02/01/2023   VD25OH 27.4 (L) 11/15/2022    Plan: Continue and refill   prescription ergocalciferol  50,000 IU weekly Low vitamin D  levels can be associated with adiposity and may result in leptin resistance and weight gain. Also associated with fatigue.  Currently on vitamin D  supplementation without any adverse effects such as nausea, vomiting or muscle weakness.  Recheck vitamin D  levels next OV  Hot flashes Occasional hot flashes, previously more frequent. Stress from previous job may have contributed. Discussed potential hormonal and non-hormonal causes. - Consult primary care provider if hot flashes increase/persist.   Vitals Temp: 98.3 F (36.8 C) BP: 139/78 Pulse Rate: 82 SpO2: 96 %   Anthropometric Measurements Height: 5' 7 (1.702 m) Weight: 206 lb (93.4 kg) BMI (Calculated): 32.26 Weight at Last Visit: 204 lb Weight Lost Since Last Visit: 0 Weight Gained Since Last Visit: 2 lb Starting Weight: 199 lb Total Weight Loss (lbs): 0 lb (0 kg)   Body Composition  Body Fat %: 41.8 % Fat Mass (lbs): 86.2 lbs Muscle Mass (lbs): 114 lbs Total Body Water (lbs): 74.8 lbs Visceral Fat Rating : 12   Other Clinical Data Fasting: no Labs: no Today's Visit #: 13 Comments: [id number]     ASSESSMENT AND PLAN:  Diet: Anaiyah is currently in the action stage of change. As such, her goal is to get back to weight loss efforts. She has  agreed to Category 2 Plan.  Exercise: [name] has been instructed to work up to a goal of 150 minutes of combined cardio and strengthening exercise per week and to try a geriatric exercise plan for weight loss and overall health benefits.   Behavior Modification:  We discussed the following Behavioral Modification Strategies today: increasing lean protein intake, decreasing simple carbohydrates, increasing vegetables, increase H2O intake, increase high fiber foods, meal planning and cooking strategies, better snacking choices, avoiding temptations, and planning for success. We discussed various medication options to  help Saranda with her weight loss efforts and we both agreed to continue current treatment plan.  Return in about 6 weeks (around 01/22/2024).SABRA She was informed of the importance of frequent follow up visits to maximize her success with intensive lifestyle modifications for her multiple health conditions.  Attestation Statements:   Reviewed by clinician on day of visit: allergies, medications, problem list, medical history, surgical history, family history, social history, and previous encounter notes.   Time spent on visit including pre-visit chart review and post-visit care and charting was 28 minutes.    Conan Mcmanaway, PA-C

## 2023-12-11 ENCOUNTER — Ambulatory Visit (INDEPENDENT_AMBULATORY_CARE_PROVIDER_SITE_OTHER): Admitting: Physician Assistant

## 2023-12-11 ENCOUNTER — Encounter (INDEPENDENT_AMBULATORY_CARE_PROVIDER_SITE_OTHER): Payer: Self-pay | Admitting: Physician Assistant

## 2023-12-11 VITALS — BP 139/78 | HR 82 | Temp 98.3°F | Ht 67.0 in | Wt 206.0 lb

## 2023-12-11 DIAGNOSIS — E88819 Insulin resistance, unspecified: Secondary | ICD-10-CM | POA: Diagnosis not present

## 2023-12-11 DIAGNOSIS — Z6831 Body mass index (BMI) 31.0-31.9, adult: Secondary | ICD-10-CM

## 2023-12-11 DIAGNOSIS — R232 Flushing: Secondary | ICD-10-CM | POA: Diagnosis not present

## 2023-12-11 DIAGNOSIS — M25552 Pain in left hip: Secondary | ICD-10-CM | POA: Diagnosis not present

## 2023-12-11 DIAGNOSIS — M25551 Pain in right hip: Secondary | ICD-10-CM

## 2023-12-11 DIAGNOSIS — E538 Deficiency of other specified B group vitamins: Secondary | ICD-10-CM

## 2023-12-11 DIAGNOSIS — E559 Vitamin D deficiency, unspecified: Secondary | ICD-10-CM | POA: Diagnosis not present

## 2023-12-11 DIAGNOSIS — Z6832 Body mass index (BMI) 32.0-32.9, adult: Secondary | ICD-10-CM

## 2023-12-11 DIAGNOSIS — R632 Polyphagia: Secondary | ICD-10-CM | POA: Diagnosis not present

## 2023-12-11 DIAGNOSIS — M25561 Pain in right knee: Secondary | ICD-10-CM

## 2023-12-11 DIAGNOSIS — Z6830 Body mass index (BMI) 30.0-30.9, adult: Secondary | ICD-10-CM

## 2023-12-11 DIAGNOSIS — M25562 Pain in left knee: Secondary | ICD-10-CM | POA: Diagnosis not present

## 2023-12-11 DIAGNOSIS — E669 Obesity, unspecified: Secondary | ICD-10-CM

## 2023-12-11 DIAGNOSIS — E66811 Obesity, class 1: Secondary | ICD-10-CM

## 2023-12-11 MED ORDER — VITAMIN B-12 1000 MCG PO TABS: 1000.0000 ug | ORAL_TABLET | ORAL | 0 refills | Status: AC

## 2023-12-11 MED ORDER — FOLIC ACID 1 MG PO TABS: 1.0000 mg | ORAL_TABLET | Freq: Every day | ORAL | 2 refills | Status: DC

## 2023-12-11 MED ORDER — VITAMIN D (ERGOCALCIFEROL) 1.25 MG (50000 UNIT) PO CAPS: 50000.0000 [IU] | ORAL_CAPSULE | ORAL | 0 refills | Status: DC

## 2023-12-11 MED ORDER — SEMAGLUTIDE-WEIGHT MANAGEMENT 2.4 MG/0.75ML ~~LOC~~ SOAJ: 2.4000 mg | SUBCUTANEOUS | 0 refills | Status: DC

## 2024-01-27 NOTE — Progress Notes (Unsigned)
 SUBJECTIVE: Discussed the use of AI scribe software for clinical note transcription with the patient, who gave verbal consent to proceed.  Chief Complaint: Obesity  Interim History: She is down 1 lbs since her last visit.   Robin Mills is here to discuss her progress with her obesity treatment plan. She is on the Category 2 Plan and states she is following her eating plan approximately 60 % of the time. She states she is exercising walking 30-40 minutes 2 times per week.  Robin Mills is a 73 year old female who presents for follow-up of her obesity treatment plan.  She has been on Wegovy  2.4 mg weekly for obesity with polyphagia since before Christmas 2024. Despite this, she has not experienced significant weight loss and is concerned about a potential plateau. She is considering contacting her insurance regarding coverage for alternative medications.  She has a history of hypertension and is currently taking losartan  hydrochlorothiazide  100-25 mg once daily for blood pressure management. She also has obstructive sleep apnea and uses a CPAP machine.  She experiences polyphagia with insulin  resistance. Her last A1c was 5.5; she has insulin  resistance.  She has vitamin D , B12, and folate deficiencies. She is on B12 1000 mcg twice weekly, folate 1 mg daily, and ergocalciferol  50,000 units once weekly. She is concerned about her B12 absorption and energy levels, which she describes as 'not great'.  She recently resigned from a job due to knee pain from standing for long periods, which she attributes to her weight and age. She has not sought medical evaluation for her knees but suspects arthritis.  She is starting to go to the gym and plans to begin on Sunday. She prefers walking and has worked with a Psychologist, educational in the past.  Fasting labs obtained today.  The patient was informed we would discuss the lab results at the next visit unless there is a critical issue that needs to be addressed sooner.  The patient agreed to keep the next visit at the agreed upon time to discuss these results.   OBJECTIVE: Visit Diagnoses: Problem List Items Addressed This Visit     Other fatigue   Relevant Orders   TSH   Folate   Polyphagia - Primary   Relevant Medications   semaglutide -weight management (WEGOVY ) 2.4 MG/0.75ML SOAJ SQ injection   OSA (obstructive sleep apnea)   Folate deficiency   Relevant Medications   folic acid  (FOLVITE ) 1 MG tablet   Other Relevant Orders   Folate   B12 deficiency   Relevant Orders   Vitamin B12   CBC with Differential/Platelet   Vitamin D  deficiency   Relevant Medications   Vitamin D , Ergocalciferol , (DRISDOL ) 1.25 MG (50000 UNIT) CAPS capsule   Other Relevant Orders   VITAMIN D  25 Hydroxy (Vit-D Deficiency, Fractures)   Obesity   Relevant Medications   semaglutide -weight management (WEGOVY ) 2.4 MG/0.75ML SOAJ SQ injection   Other Visit Diagnoses       Insulin  resistance       Relevant Orders   CMP14+EGFR   Hemoglobin A1c   Insulin , random   Lipid Panel With LDL/HDL Ratio     Chronic knee pain, unspecified laterality         BMI 32.0-32.9,adult Current BMI 32.1         Obesity with insulin  resistance and polyphagia Obesity with associated insulin  resistance and polyphagia. Currently on Wegovy  2.4 mg weekly. No significant weight loss, but maintained muscle mass and lost adipose tissue. Discussed potential plateau effect  of Wegovy , typically seen around 16-18 months. Cardiovascular benefits of Wegovy  discussed. Consideration of alternative medications like Zepbound or Mounjaro, which may be covered by insurance due to sleep apnea and insulin  resistance. - Refill Wegovy  2.4 mg weekly - Call insurance to inquire about coverage for Zepbound or Mounjaro, mentioning sleep apnea and insulin  resistance - Check labs today, including A1c  Insulin  Resistance Last fasting insulin  was 22.6- not at goal. A1c was 5.5- at  goal. Polyphagia:Yes Medication(s): Wegovy  2.4 mg SQ weekly Denies mass in neck, dysphagia, dyspepsia, persistent hoarseness, abdominal pain, or N/V/Constipation or diarrhea. Has annual eye exam. Mood is stable.   Lab Results  Component Value Date   HGBA1C 5.5 07/04/2023   HGBA1C 5.2 11/15/2022   Lab Results  Component Value Date   INSULIN  22.6 07/04/2023   INSULIN  17.7 11/15/2022    Plan: Continue and refill Wegovy  2.4 mg SQ weekly Continue working on nutrition plan to decrease simple carbohydrates, increase lean proteins and exercise to promote weight loss, improve glycemic control and prevent progression to Type 2 diabetes.  Recheck fasting labs today.   Knee pain, likely osteoarthritis Knee pain likely due to osteoarthritis, exacerbated by prolonged standing. Discussed potential benefits of weight loss on knee pain. Consideration of imaging to assess for arthritis and potential treatments like steroid or gel injections for symptom relief. Discussed newer treatments such as gel injections that can provide relief for months and improve mobility, which may aid in weight management. - Consider knee x-ray to assess for arthritis - Consider referral to orthopedics for further evaluation and management  Fatigue Fatigue possibly related to obesity and vitamin deficiencies. Discussed checking thyroid  function due to family history of thyroid  issues and rechecking B12 levels. - Check thyroid  function - Recheck B12 levels  Obstructive sleep apnea Obstructive sleep apnea managed with CPAP. Discussed potential insurance coverage for Zepbound due to its FDA approval for sleep apnea. She will see if insurance may cover Zepbound for OSA Plan: Continue CPAP therapy Intensive lifestyle modifications are the first line treatment for this issue. We discussed several lifestyle modifications today and she will continue to work on diet, exercise and weight loss efforts. We will continue to monitor.  Orders and follow up as documented in patient record.   Hypertension Hypertension managed with losartan  hydrochlorothiazide  100-25 mg daily. BP Readings from Last 3 Encounters:  01/28/24 102/66  12/11/23 139/78  10/15/23 117/65   Lab Results  Component Value Date   NA 141 07/04/2023   CL 99 07/04/2023   K 3.7 07/04/2023   CO2 20 07/04/2023   BUN 11 07/04/2023   CREATININE 0.73 07/04/2023   EGFR 88 07/04/2023   CALCIUM 9.6 07/04/2023   ALBUMIN 4.4 07/04/2023   GLUCOSE 98 07/04/2023   Plan:  Continue losartan -hydrochlorothiazide  100-25 mg daily Continue to work on nutrition plan to promote weight loss and improve BP control.    Vitamin B12, folate, and vitamin D  deficiencies Vitamin B12, folate, and vitamin D  deficiencies managed with B12 1000 mcg twice weekly, folate 1 mg daily, and ergocalciferol  50,000 units weekly. Discussed rechecking B12 and folate levels to ensure adequate absorption. - Recheck B12 and folate levels and vitamin D  levels and adjust supplementation accordingly.   Vitals Temp: 98.2 F (36.8 C) BP: 102/66 Pulse Rate: 80 SpO2: 97 %   Anthropometric Measurements Height: 5' 7 (1.702 m) Weight: 205 lb (93 kg) BMI (Calculated): 32.1 Weight at Last Visit: 206 lb Weight Lost Since Last Visit: 1 lb Weight Gained Since  Last Visit: 0 Starting Weight: 199 lb Total Weight Loss (lbs): 0 lb (0 kg)   Body Composition  Body Fat %: 41.7 % Fat Mass (lbs): 85.6 lbs Muscle Mass (lbs): 113.6 lbs Total Body Water (lbs): 76.6 lbs Visceral Fat Rating : 12   Other Clinical Data Fasting: Yes Labs: Yes Today's Visit #: 14 Starting Date: 11/15/22     ASSESSMENT AND PLAN:  Diet: Robin Mills is currently in the action stage of change. As such, her goal is to continue with weight loss efforts. She has agreed to Category 2 Plan.  Exercise: Robin Mills has been instructed to work up to a goal of 150 minutes of combined cardio and strengthening exercise per week for  weight loss and overall health benefits.   Behavior Modification:  We discussed the following Behavioral Modification Strategies today: increasing lean protein intake, decreasing simple carbohydrates, increasing vegetables, increase H2O intake, increase high fiber foods, meal planning and cooking strategies, avoiding temptations, and planning for success. We discussed various medication options to help Robin Mills with her weight loss efforts and we both agreed to continue current treatment plan.  Return in about 6 weeks (around 03/10/2024).SABRA She was informed of the importance of frequent follow up visits to maximize her success with intensive lifestyle modifications for her multiple health conditions.  Attestation Statements:   Reviewed by clinician on day of visit: allergies, medications, problem list, medical history, surgical history, family history, social history, and previous encounter notes.   Time spent on visit including pre-visit chart review and post-visit care and charting was 30 minutes.    Cleburn Maiolo, PA-C

## 2024-01-28 ENCOUNTER — Ambulatory Visit (INDEPENDENT_AMBULATORY_CARE_PROVIDER_SITE_OTHER): Admitting: Physician Assistant

## 2024-01-28 ENCOUNTER — Encounter (INDEPENDENT_AMBULATORY_CARE_PROVIDER_SITE_OTHER): Payer: Self-pay | Admitting: Physician Assistant

## 2024-01-28 VITALS — BP 102/66 | HR 80 | Temp 98.2°F | Ht 67.0 in | Wt 205.0 lb

## 2024-01-28 DIAGNOSIS — R632 Polyphagia: Secondary | ICD-10-CM

## 2024-01-28 DIAGNOSIS — Z6832 Body mass index (BMI) 32.0-32.9, adult: Secondary | ICD-10-CM

## 2024-01-28 DIAGNOSIS — R5383 Other fatigue: Secondary | ICD-10-CM

## 2024-01-28 DIAGNOSIS — G4733 Obstructive sleep apnea (adult) (pediatric): Secondary | ICD-10-CM | POA: Diagnosis not present

## 2024-01-28 DIAGNOSIS — M25569 Pain in unspecified knee: Secondary | ICD-10-CM

## 2024-01-28 DIAGNOSIS — M25551 Pain in right hip: Secondary | ICD-10-CM

## 2024-01-28 DIAGNOSIS — E559 Vitamin D deficiency, unspecified: Secondary | ICD-10-CM | POA: Diagnosis not present

## 2024-01-28 DIAGNOSIS — E538 Deficiency of other specified B group vitamins: Secondary | ICD-10-CM | POA: Diagnosis not present

## 2024-01-28 DIAGNOSIS — E88819 Insulin resistance, unspecified: Secondary | ICD-10-CM

## 2024-01-28 DIAGNOSIS — G8929 Other chronic pain: Secondary | ICD-10-CM | POA: Diagnosis not present

## 2024-01-28 DIAGNOSIS — E669 Obesity, unspecified: Secondary | ICD-10-CM | POA: Diagnosis not present

## 2024-01-28 DIAGNOSIS — Z6831 Body mass index (BMI) 31.0-31.9, adult: Secondary | ICD-10-CM

## 2024-01-28 MED ORDER — FOLIC ACID 1 MG PO TABS: 1.0000 mg | ORAL_TABLET | Freq: Every day | ORAL | 2 refills | Status: DC

## 2024-01-28 MED ORDER — SEMAGLUTIDE-WEIGHT MANAGEMENT 2.4 MG/0.75ML ~~LOC~~ SOAJ: 2.4000 mg | SUBCUTANEOUS | 0 refills | Status: DC

## 2024-01-28 MED ORDER — VITAMIN D (ERGOCALCIFEROL) 1.25 MG (50000 UNIT) PO CAPS: 50000.0000 [IU] | ORAL_CAPSULE | ORAL | 0 refills | Status: DC

## 2024-01-29 LAB — CBC WITH DIFFERENTIAL/PLATELET
Basophils Absolute: 0 x10E3/uL (ref 0.0–0.2)
Basos: 1 %
EOS (ABSOLUTE): 0.3 x10E3/uL (ref 0.0–0.4)
Eos: 4 %
Hematocrit: 38.8 % (ref 34.0–46.6)
Hemoglobin: 12.8 g/dL (ref 11.1–15.9)
Immature Grans (Abs): 0 x10E3/uL (ref 0.0–0.1)
Immature Granulocytes: 0 %
Lymphocytes Absolute: 1.4 x10E3/uL (ref 0.7–3.1)
Lymphs: 20 %
MCH: 30.3 pg (ref 26.6–33.0)
MCHC: 33 g/dL (ref 31.5–35.7)
MCV: 92 fL (ref 79–97)
Monocytes Absolute: 0.4 x10E3/uL (ref 0.1–0.9)
Monocytes: 6 %
Neutrophils Absolute: 4.7 x10E3/uL (ref 1.4–7.0)
Neutrophils: 69 %
Platelets: 280 x10E3/uL (ref 150–450)
RBC: 4.23 x10E6/uL (ref 3.77–5.28)
RDW: 13 % (ref 11.7–15.4)
WBC: 6.8 x10E3/uL (ref 3.4–10.8)

## 2024-01-29 LAB — CMP14+EGFR
ALT: 16 IU/L (ref 0–32)
AST: 17 IU/L (ref 0–40)
Albumin: 4.4 g/dL (ref 3.8–4.8)
Alkaline Phosphatase: 81 IU/L (ref 49–135)
BUN/Creatinine Ratio: 16 (ref 12–28)
BUN: 11 mg/dL (ref 8–27)
Bilirubin Total: 0.4 mg/dL (ref 0.0–1.2)
CO2: 26 mmol/L (ref 20–29)
Calcium: 9.9 mg/dL (ref 8.7–10.3)
Chloride: 100 mmol/L (ref 96–106)
Creatinine, Ser: 0.7 mg/dL (ref 0.57–1.00)
Globulin, Total: 2.5 g/dL (ref 1.5–4.5)
Glucose: 84 mg/dL (ref 70–99)
Potassium: 3.9 mmol/L (ref 3.5–5.2)
Sodium: 142 mmol/L (ref 134–144)
Total Protein: 6.9 g/dL (ref 6.0–8.5)
eGFR: 92 mL/min/1.73 (ref >59)

## 2024-01-29 LAB — HEMOGLOBIN A1C
Est. average glucose Bld gHb Est-mCnc: 105 mg/dL
Hgb A1c MFr Bld: 5.3 % (ref 4.8–5.6)

## 2024-01-29 LAB — LIPID PANEL WITH LDL/HDL RATIO
Cholesterol, Total: 213 mg/dL — ABNORMAL HIGH (ref 100–199)
HDL: 79 mg/dL (ref >39)
LDL Chol Calc (NIH): 103 mg/dL — ABNORMAL HIGH (ref 0–99)
LDL/HDL Ratio: 1.3 ratio (ref 0.0–3.2)
Triglycerides: 186 mg/dL — ABNORMAL HIGH (ref 0–149)
VLDL Cholesterol Cal: 31 mg/dL (ref 5–40)

## 2024-01-29 LAB — INSULIN, RANDOM: INSULIN: 12 u[IU]/mL (ref 2.6–24.9)

## 2024-01-29 LAB — VITAMIN B12: Vitamin B-12: >2000 pg/mL — ABNORMAL HIGH (ref 232–1245)

## 2024-01-29 LAB — VITAMIN D 25 HYDROXY (VIT D DEFICIENCY, FRACTURES): Vit D, 25-Hydroxy: 50.1 ng/mL (ref 30.0–100.0)

## 2024-01-29 LAB — FOLATE: Folate: >20 ng/mL (ref >3.0)

## 2024-01-29 LAB — TSH: TSH: 2.96 u[IU]/mL (ref 0.450–4.500)

## 2024-01-31 DIAGNOSIS — G4733 Obstructive sleep apnea (adult) (pediatric): Secondary | ICD-10-CM | POA: Diagnosis not present

## 2024-03-03 ENCOUNTER — Other Ambulatory Visit (INDEPENDENT_AMBULATORY_CARE_PROVIDER_SITE_OTHER): Payer: Self-pay | Admitting: Physician Assistant

## 2024-03-03 DIAGNOSIS — E559 Vitamin D deficiency, unspecified: Secondary | ICD-10-CM

## 2024-03-10 NOTE — Progress Notes (Unsigned)
 SUBJECTIVE: Discussed the use of AI scribe software for clinical note transcription with the patient, who gave verbal consent to proceed.  Chief Complaint: Obesity  Interim History: She is up 2 lbs since her last visit.    Robin Mills is here to discuss her progress with her obesity treatment plan. She is on the Category 2 Plan and states she is following her eating plan approximately 65 % of the time. She states she is exercising walking 30 minutes 2 times per week. The patient is a 73 year old with obesity who presents for follow-up of her obesity treatment plan.  She has a history of obesity with polyphagia and is currently on Wegovy  2.4 mg once weekly. Despite being on the maximum dose since June of last year, she experiences fluctuating weight loss and gain, with her weight not showing the expected results. Her best weight was 194 pounds in August of last year. She is concerned about the cost of Wegovy , noting that she has not been charged for the past two months, which she finds unusual.  She has insulin  resistance, with her insulin  level having improved from 22.6 to 12.0, though it remains slightly elevated. Her HOMA-IR score is 2.5, indicating insulin  resistance. Her A1c is 5.3, which is normal, and her glucose level is 84.  She has obstructive sleep apnea and uses CPAP. She is on losartan  hydrochlorothiazide  100/25 mg once daily for hypertension. Her blood work shows normal kidney and liver function. Her LDL cholesterol has decreased, but her triglycerides have increased.  She has vitamin D  and B12 deficiencies, as well as a folate deficiency. She takes ergocalciferol  50,000 units once weekly, B12 1,000 mcg twice weekly, and folic acid  1 mg daily. Her folate levels are good, and her vitamin D  levels continue to look good.  Her energy levels are fine, and her hemoglobin, hematocrit, and white blood cell count are within normal ranges. She mentions a family history of gallbladder issues but  denies any current symptoms.  She discusses her diet, noting she struggles with breakfast but usually has an egg around 10 AM. She tries to manage her carbohydrate intake and includes vegetables in her diet. She acknowledges not drinking enough water and not utilizing her gym membership, though she feels physically capable when active.  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 Weight has been fluctuating with no significant progress despite maximum dose.  Insulin  resistance persists with HOMA-IR score of 2.5. Considering switch to Zepbound due to its dual receptor action, potentially offering better weight loss and fewer side effects.  Insurance coverage for Zepbound under obstructive sleep apnea or insulin  resistance is uncertain. Polyphagia not well controlled on Wegovy .  - Contact insurance to check coverage for Zepbound under obstructive sleep apnea or insulin  resistance. - If covered, will initiate Zepbound at 7.5 mg weekly. -  If not covered, will consider out-of-pocket option or alternative medications. - Continue Wegovy  until Zepbound is initiated. - Encouraged increased physical activity, including strength training, to build muscle mass. - Encouraged dietary modifications, including portion control and increased protein intake.  Insulin  resistance Her HOMA-IR is 2.5 which is elevated. Optimal level < 1.9. This  is complex condition associated with genetics, ectopic fat and lifestyle factors. Insulin  resistance may result in weight gain, abnormal cravings (particularly for carbs) and fatigue. This may result in additional weight gain and lead to pre-diabetes and diabetes if untreated.   Lab Results  Component Value Date   HGBA1C 5.3 01/28/2024   Lab Results  Component Value Date   INSULIN  12.0 01/28/2024   INSULIN  22.6 07/04/2023   INSULIN  17.7 11/15/2022   Lab Results  Component Value Date   GLUCOSE 84 01/28/2024   GLUCOSE 83 06/28/2014    We reviewed treatment options which include losing 7 to 10% of body weight, increasing physical activity to a 150 minutes a week of moderate intensity.She has not responded well with clinically significant weight loss while on Wegovy  for more than 1 year at maximum dose.  We discussed other options for treatment and she is going to see if her insurance will cover other medications.    Obstructive sleep apnea Managed with CPAP., remains symptomatic.  Intensive lifestyle modifications are the first line treatment for this issue. We discussed several lifestyle modifications today and she will continue to work on diet, exercise and weight loss efforts. We will continue to monitor. Orders and follow up as documented in patient record. Educated pt that OSA is a cause of systemic hypertension and is associated with an increased incidence of stroke, heart failure, atrial fibrillation, and coronary heart disease.  Severe OSA increases all-cause mortality and cardiovascular mortality.  -Goal: Treatment of OSA via CPAP compliance and weight loss. Plasma ghrelin levels (appetite or "hunger hormone") are significantly higher in OSA patients than in BMI-matched controls, but decrease to levels similar to those of obese patients without OSA after CPAP treatment.  Weight loss improves OSA by several mechanisms, including reduction in fatty tissue in the throat (i.e.  parapharyngeal fat) and the tongue. Loss of abdominal fat increases mediastinal traction on the upper airway making it less likely to collapse during sleep. Studies have also shown that compliance with CPAP treatment improves leptin imbalance.  Potential insurance coverage for Zepbound under this diagnosis. - She is going to check insurance coverage for Zepbound under obstructive sleep apnea diagnosis.  Hypertension Managed with losartan  hydrochlorothiazide  100/25 mg daily. BP Readings from Last 3 Encounters:  03/11/24 125/74  01/28/24 102/66  12/11/23 139/78   Lab Results  Component Value Date   NA 142 01/28/2024   CL 100 01/28/2024   K 3.9 01/28/2024   CO2 26 01/28/2024   BUN 11 01/28/2024   CREATININE 0.70 01/28/2024   EGFR 92 01/28/2024   CALCIUM 9.9 01/28/2024   ALBUMIN 4.4 01/28/2024   GLUCOSE 84 01/28/2024   Plan:  Continue losartan -hydrochlorothiazide  100/25 mg daily Continue to work on nutrition plan to promote weight loss and improve BP control.    Vitamin D , B12, and folate deficiencies Vitamin D  deficiency managed with ergocalciferol  50,000 units weekly. B12 and folate levels are well-managed with current supplementation. Last vitamin D  Lab Results  Component Value Date   VD25OH 50.1 01/28/2024   Lab Results  Component Value Date   VITAMINB12 >2000 (H) 01/28/2024   - Refilled ergocalciferol  prescription. - Continue current  B12 and folate supplementation. Meds ordered this encounter  Medications   Vitamin D , Ergocalciferol , (DRISDOL ) 1.25 MG (50000 UNIT) CAPS capsule    Sig: Take 1 capsule (50,000 Units total) by mouth every 7 (seven) days.    Dispense:  5 capsule    Refill:  0    Hyperlipidemia LDL cholesterol decreased, but triglycerides increased, possibly due to simple carbohydrate intake. Current treatment with Wegovy  has not improved triglyceride levels. On Wegovy  2.4 mg weekly which should lower CV risks. Not on statin therapy.  Last  lipids Lab Results  Component Value Date   CHOL 213 (H) 01/28/2024   HDL 79 01/28/2024   LDLCALC 103 (H) 01/28/2024   TRIG 186 (H) 01/28/2024   CHOLHDL 2.8 11/15/2022   The 10-year ASCVD risk score (Arnett DK, et al., 2019) is: 12.9%   Values used to calculate the score:     Age: 15 years     Clincally relevant sex: Female     Is Non-Hispanic African American: No     Diabetic: No     Tobacco smoker: No     Systolic Blood Pressure: 125 mmHg     Is BP treated: Yes     HDL Cholesterol: 79 mg/dL     Total Cholesterol: 213 mg/dL Continue to work on nutrition plan -decreasing simple carbohydrates, increasing lean proteins, decreasing saturated fats and cholesterol , avoiding trans fats and exercise as able to promote weight loss, improve lipids and decrease cardiovascular risks. - Encouraged dietary modifications to reduce simple carbohydrate intake. - Continue Wegovy , but may also want to consider statin therapy if does not show improvement .   Depression Managed with Effexor  37.5 mg daily. Discussion of potential switch to bupropion to aid in weight loss and manage cravings. Effexor  may contribute to weight maintenance. - Discuss potential switch to bupropion with internist during annual visit.  Vitals Temp: 97.9 F (36.6 C) BP: 125/74 Pulse Rate: 73 SpO2: 99 %   Anthropometric Measurements Height: 5' 7 (1.702 m) Weight: 207 lb (93.9 kg) BMI (Calculated): 32.41 Weight at Last Visit: 205 lb Weight Lost Since Last Visit: 0 Weight Gained Since Last Visit: 2 lb Starting Weight: 199 lb Total Weight Loss (lbs): 0 lb (0 kg)   Body Composition  Body Fat %: 42.9 % Fat Mass (lbs): 88.8 lbs Muscle Mass (lbs): 112.4 lbs Total Body Water (lbs): 77.6 lbs Visceral Fat Rating : 13   Other Clinical Data Fasting: No Labs: No Today's Visit #: 15 Starting Date: 11/15/22     ASSESSMENT AND PLAN:  Diet: Robin Mills is currently in the action stage of change. As such, her goal is  to continue with weight loss efforts. She has agreed to Category 2 Plan.  Exercise: Robin Mills has been instructed to work up to a goal of 150 minutes of combined cardio and strengthening exercise per week for weight loss and overall health benefits.   Behavior Modification:  We discussed the following Behavioral Modification Strategies today: increasing lean protein intake, decreasing simple carbohydrates, increasing vegetables, increase H2O intake, increase high fiber foods, meal planning and cooking strategies, holiday eating strategies, avoiding temptations, and planning for success. We discussed various medication options to help Robin Mills with her weight loss efforts and we both agreed to continue current treatment plan with Wegovy  2.4 mg weekly and she is going to check with insurance for coverage for Zepbound under OSA diagnosis.  Return in about 5 weeks (around 04/15/2024).Robin Mills She was informed of the importance of frequent follow  up visits to maximize her success with intensive lifestyle modifications for her multiple health conditions.  Attestation Statements:   Reviewed by clinician on day of visit: allergies, medications, problem list, medical history, surgical history, family history, social history, and previous encounter notes.   Time spent on visit including pre-visit chart review and post-visit care and charting was 44 minutes.    Robin Patch, PA-C

## 2024-03-11 ENCOUNTER — Encounter (INDEPENDENT_AMBULATORY_CARE_PROVIDER_SITE_OTHER): Payer: Self-pay | Admitting: Physician Assistant

## 2024-03-11 ENCOUNTER — Ambulatory Visit (INDEPENDENT_AMBULATORY_CARE_PROVIDER_SITE_OTHER): Payer: Self-pay | Admitting: Physician Assistant

## 2024-03-11 VITALS — BP 125/74 | HR 73 | Temp 97.9°F | Ht 67.0 in | Wt 207.0 lb

## 2024-03-11 DIAGNOSIS — E785 Hyperlipidemia, unspecified: Secondary | ICD-10-CM

## 2024-03-11 DIAGNOSIS — E669 Obesity, unspecified: Secondary | ICD-10-CM

## 2024-03-11 DIAGNOSIS — E7800 Pure hypercholesterolemia, unspecified: Secondary | ICD-10-CM

## 2024-03-11 DIAGNOSIS — Z6832 Body mass index (BMI) 32.0-32.9, adult: Secondary | ICD-10-CM

## 2024-03-11 DIAGNOSIS — F32A Depression, unspecified: Secondary | ICD-10-CM | POA: Diagnosis not present

## 2024-03-11 DIAGNOSIS — E538 Deficiency of other specified B group vitamins: Secondary | ICD-10-CM

## 2024-03-11 DIAGNOSIS — I10 Essential (primary) hypertension: Secondary | ICD-10-CM | POA: Diagnosis not present

## 2024-03-11 DIAGNOSIS — R632 Polyphagia: Secondary | ICD-10-CM | POA: Diagnosis not present

## 2024-03-11 DIAGNOSIS — G4733 Obstructive sleep apnea (adult) (pediatric): Secondary | ICD-10-CM

## 2024-03-11 DIAGNOSIS — E559 Vitamin D deficiency, unspecified: Secondary | ICD-10-CM | POA: Diagnosis not present

## 2024-03-11 DIAGNOSIS — E88819 Insulin resistance, unspecified: Secondary | ICD-10-CM | POA: Diagnosis not present

## 2024-03-11 DIAGNOSIS — E66811 Obesity, class 1: Secondary | ICD-10-CM

## 2024-03-11 DIAGNOSIS — R4589 Other symptoms and signs involving emotional state: Secondary | ICD-10-CM

## 2024-03-11 MED ORDER — VITAMIN D (ERGOCALCIFEROL) 1.25 MG (50000 UNIT) PO CAPS: 50000.0000 [IU] | ORAL_CAPSULE | ORAL | 0 refills | Status: DC

## 2024-03-12 ENCOUNTER — Telehealth (INDEPENDENT_AMBULATORY_CARE_PROVIDER_SITE_OTHER): Payer: Self-pay | Admitting: Physician Assistant

## 2024-03-12 NOTE — Telephone Encounter (Signed)
 Received call from [facility] stating pts zepbound needs a authorizaton and you can do it online if needed.Her phone was cutting in and out and then disconnected.

## 2024-04-03 ENCOUNTER — Telehealth (INDEPENDENT_AMBULATORY_CARE_PROVIDER_SITE_OTHER): Payer: Self-pay | Admitting: Physician Assistant

## 2024-04-03 NOTE — Telephone Encounter (Signed)
 Pt called about her Zepbound that she was switched to at her last appt on 11/11.She did contact her [employer] and they said it needed a authorization but she has not heard anything back. Please call pt with update on the zepbound.

## 2024-04-15 NOTE — Progress Notes (Unsigned)
 SUBJECTIVE: Discussed the use of AI scribe software for clinical note transcription with the patient, who gave verbal consent to proceed.  Chief Complaint: Obesity  Interim History: She is down 1 lb since her last visit.   Robin Mills is here to discuss her progress with her obesity treatment plan. She is on the Category 2 Plan and states she is following her eating plan approximately 50 % of the time. She states she is exercising walking 30 minutes 2 times per week. Robin Mills is a 73 year old female who presents for follow-up of her obesity treatment plan.  She has been using Wegovy  2.4 mg once weekly for medical weight loss but finds it ineffective. She has not experienced any issues with Wegovy  such as nausea or vomiting or any other side effects. She has stalled as far as weight loss. Reports ongoing cravings and lack of appetite control with current medication. We discussed switching to tirzepatide  and she is ready to move forward with this medication.  We have reviewed the risks and benefits of using a GLP-1/GIP. The patient denies a personal or family history of medullary thyroid  cancer or MENII. The patient denies a history of pancreatitis. The potential risks and benefits of this GLP-1/GIP were reviewed with the patient, and alternative treatment options were discussed. All questions were answered, and the patient wishes to move forward with tirzepatide . She has tolerated Wegovy  well.    She is tracking her calories, eating more whole foods and getting adequate protein. She is drinking adequate water. She is skipping meals but does usually make up for overall protein and calories at other meals. She is sleeping at least 7 hours nightly.   She has a history of polyphagia and insulin  resistance. Her current medications include losartan  and hydrochlorothiazide  100/25 mg once daily for blood pressure management. She also takes B12 1000 mcg twice weekly, folate 1 mg daily, and vitamin D   (ergocalciferol ) 50,000 units once weekly for vitamin D  deficiency.  She is mindful of her protein intake and hydration, drinking at least six cups of water daily.  OBJECTIVE: Visit Diagnoses: Problem List Items Addressed This Visit     Polyphagia   Relevant Medications   tirzepatide  (MOUNJARO ) 7.5 MG/0.5ML Pen   OSA (obstructive sleep apnea) - Primary   Relevant Medications   tirzepatide  (MOUNJARO ) 7.5 MG/0.5ML Pen   Folate deficiency   B12 deficiency   Vitamin D  deficiency   Relevant Medications   Vitamin D , Ergocalciferol , (DRISDOL ) 1.25 MG (50000 UNIT) CAPS capsule   Obesity   Relevant Medications   tirzepatide  (MOUNJARO ) 7.5 MG/0.5ML Pen   Other Visit Diagnoses       Insulin  resistance       Relevant Medications   tirzepatide  (MOUNJARO ) 7.5 MG/0.5ML Pen     BMI 32.0-32.9,adult Current BMI 32.3         Obesity Management with Wegovy  2.4 mg weekly has stalled and no longer effective.  Transitioning to Mounjaro  due to insurance coverage for OSA/insulin  resistance as well as medical weight loss.  Mounjaro  targets two receptors, potentially offering better efficacy for weight loss and appetite suppression.  Discussed potential side effects such as nausea  and other GI side effects and discussed ways to minimize side effects during transition. Emphasized the importance of maintaining hydration and protein intake, especially with increased appetite suppression. - Switched from Wegovy  to Mounjaro  7.5 mg once weekly. - Instructed on proper injection technique for Mounjaro . - Advised to maintain hydration and protein intake. - Scheduled  follow-up to assess tolerance and efficacy of Mounjaro . Meds ordered this encounter  Medications   tirzepatide  (MOUNJARO ) 7.5 MG/0.5ML Pen    Sig: Inject 7.5 mg into the skin once a week.    Dispense:  6 mL    Refill:  1   Vitamin D , Ergocalciferol , (DRISDOL ) 1.25 MG (50000 UNIT) CAPS capsule    Sig: Take 1 capsule (50,000 Units total) by  mouth every 7 (seven) days.    Dispense:  5 capsule    Refill:  0    Insulin  resistance and polyphagia Insulin  resistance and polyphagia are not well controlled on current Wegovy  at max dose. Switching to Mounjaro  which is expected to improve symptoms due to its dual receptor action.  - Switching to  Mounjaro  for insulin  resistance and polyphagia as well as medical weight loss. - will titrate for effect over the next few months.    Vitamin B12 deficiency Managed with B12 supplementation. Reports good energy levels overall.  Lab Results  Component Value Date   VITAMINB12 >2000 (H) 01/28/2024   B 12 was decreased to twice weekly based on above result.  Will monitor B 12 periodically - Continue B12 1000 mcg twice weekly.  Folate deficiency Lab Results  Component Value Date   FOLATE >20.0 01/28/2024   Levels now in adequate range.  - Continue folate 1 mg daily.  Vitamin D  deficiency Managed with ergocalciferol  supplementation. No N/V or muscle weakness  Last vitamin D  Lab Results  Component Value Date   VD25OH 50.1 01/28/2024   Low vitamin D  levels can be associated with adiposity and may result in leptin resistance and weight gain. Also associated with fatigue.  Currently on vitamin D  supplementation without any adverse effects such as nausea, vomiting or muscle weakness.  - Continue/refill ergocalciferol  50,000 units once weekly.  Hypertension Managed with losartan  and hydrochlorothiazide . BP Readings from Last 3 Encounters:  04/16/24 130/74  03/11/24 125/74  01/28/24 102/66   Lab Results  Component Value Date   NA 142 01/28/2024   CL 100 01/28/2024   K 3.9 01/28/2024   CO2 26 01/28/2024   BUN 11 01/28/2024   CREATININE 0.70 01/28/2024   EGFR 92 01/28/2024   CALCIUM 9.9 01/28/2024   ALBUMIN 4.4 01/28/2024   GLUCOSE 84 01/28/2024   Continue to work on nutrition plan to promote weight loss and improve BP control.   - Continue losartan  and hydrochlorothiazide   as prescribed by PCP  Vitals Temp: 97.7 F (36.5 C) BP: 130/74 Pulse Rate: 90 SpO2: 98 %   Anthropometric Measurements Height: 5' 7 (1.702 m) Weight at Last Visit: 207 lb Starting Weight: 199 lb Total Weight Loss (lbs): 0 lb (0 kg)   Body Composition  Body Fat %: 42.2 % Fat Mass (lbs): 87.2 lbs Muscle Mass (lbs): 113.2 lbs Total Body Water (lbs): 76 lbs Visceral Fat Rating : 13   Other Clinical Data Fasting: No Labs: No Today's Visit #: 16 Starting Date: 11/15/22     ASSESSMENT AND PLAN:  Diet: Lucindia is currently in the action stage of change. As such, her goal is to continue with weight loss efforts. She has agreed to Category 2 Plan.  Exercise: Bianco has been instructed to work up to a goal of 150 minutes of combined cardio and strengthening exercise per week for weight loss and overall health benefits.   Behavior Modification:  We discussed the following Behavioral Modification Strategies today: increasing lean protein intake, decreasing simple carbohydrates, increasing vegetables, increase H2O intake, increase  high fiber foods, no skipping meals, meal planning and cooking strategies, holiday eating strategies, avoiding temptations, and planning for success. We discussed various medication options to help Bev with her weight loss efforts and we both agreed to change to Mounjaro  7.5 mg weekly for OSA in addition to insulin  resistance and medical weight loss.  Return in about 4 weeks (around 05/14/2024).SABRA She was informed of the importance of frequent follow up visits to maximize her success with intensive lifestyle modifications for her multiple health conditions.  Attestation Statements:   Reviewed by clinician on day of visit: allergies, medications, problem list, medical history, surgical history, family history, social history, and previous encounter notes.   Time spent on visit including pre-visit chart review and post-visit care and charting was 32  minutes.    Namira Rosekrans, PA-C

## 2024-04-16 ENCOUNTER — Ambulatory Visit (INDEPENDENT_AMBULATORY_CARE_PROVIDER_SITE_OTHER): Admitting: Physician Assistant

## 2024-04-16 ENCOUNTER — Encounter (INDEPENDENT_AMBULATORY_CARE_PROVIDER_SITE_OTHER): Payer: Self-pay | Admitting: Physician Assistant

## 2024-04-16 ENCOUNTER — Telehealth (INDEPENDENT_AMBULATORY_CARE_PROVIDER_SITE_OTHER): Payer: Self-pay | Admitting: *Deleted

## 2024-04-16 VITALS — BP 130/74 | HR 90 | Temp 97.7°F | Ht 67.0 in

## 2024-04-16 DIAGNOSIS — E559 Vitamin D deficiency, unspecified: Secondary | ICD-10-CM

## 2024-04-16 DIAGNOSIS — G4733 Obstructive sleep apnea (adult) (pediatric): Secondary | ICD-10-CM

## 2024-04-16 DIAGNOSIS — E538 Deficiency of other specified B group vitamins: Secondary | ICD-10-CM | POA: Diagnosis not present

## 2024-04-16 DIAGNOSIS — Z6831 Body mass index (BMI) 31.0-31.9, adult: Secondary | ICD-10-CM | POA: Diagnosis not present

## 2024-04-16 DIAGNOSIS — R632 Polyphagia: Secondary | ICD-10-CM | POA: Diagnosis not present

## 2024-04-16 DIAGNOSIS — E66811 Obesity, class 1: Secondary | ICD-10-CM

## 2024-04-16 DIAGNOSIS — I10 Essential (primary) hypertension: Secondary | ICD-10-CM | POA: Diagnosis not present

## 2024-04-16 DIAGNOSIS — E88819 Insulin resistance, unspecified: Secondary | ICD-10-CM | POA: Diagnosis not present

## 2024-04-16 DIAGNOSIS — Z6832 Body mass index (BMI) 32.0-32.9, adult: Secondary | ICD-10-CM

## 2024-04-16 MED ORDER — VITAMIN D (ERGOCALCIFEROL) 1.25 MG (50000 UNIT) PO CAPS: 50000.0000 [IU] | ORAL_CAPSULE | ORAL | 0 refills | Status: DC

## 2024-04-16 MED ORDER — TIRZEPATIDE 7.5 MG/0.5ML ~~LOC~~ SOAJ: 7.5000 mg | SUBCUTANEOUS | 1 refills | Status: DC

## 2024-04-16 NOTE — Telephone Encounter (Signed)
 Sharilynn Caamano (Key: [id number])  Your information has been sent to [facility].

## 2024-04-17 NOTE — Telephone Encounter (Signed)
 Message from Plan There is an existing case within the [facility] environment that has the same patient, prescriber, and drug. This case must be finalized before proceeding with similar requests.  Another provider has started a PA for this same medication, and their request must be completed before we can do another request (if needed). This PA request is complete.

## 2024-04-22 ENCOUNTER — Telehealth (INDEPENDENT_AMBULATORY_CARE_PROVIDER_SITE_OTHER): Payer: Self-pay | Admitting: Physician Assistant

## 2024-04-22 ENCOUNTER — Other Ambulatory Visit (INDEPENDENT_AMBULATORY_CARE_PROVIDER_SITE_OTHER): Payer: Self-pay

## 2024-04-22 DIAGNOSIS — E88819 Insulin resistance, unspecified: Secondary | ICD-10-CM

## 2024-04-22 DIAGNOSIS — E66811 Obesity, class 1: Secondary | ICD-10-CM

## 2024-04-22 DIAGNOSIS — G4733 Obstructive sleep apnea (adult) (pediatric): Secondary | ICD-10-CM

## 2024-04-22 DIAGNOSIS — R632 Polyphagia: Secondary | ICD-10-CM

## 2024-04-22 MED ORDER — TIRZEPATIDE 7.5 MG/0.5ML ~~LOC~~ SOAJ: 7.5000 mg | SUBCUTANEOUS | 1 refills | Status: DC

## 2024-04-22 NOTE — Telephone Encounter (Signed)
 Good afternoon!  Patient said she was expecting to pick up her monjaro from the [facility] off [address] but the pharmacy denies having any request for the medicatio. She is requesting a phone call with an update.   She asked for a text if you can't reach her but I let her know I don't think that's possible and to prepare for a voicemail.   Thanks!

## 2024-05-16 ENCOUNTER — Other Ambulatory Visit (INDEPENDENT_AMBULATORY_CARE_PROVIDER_SITE_OTHER): Payer: Self-pay | Admitting: Physician Assistant

## 2024-05-16 DIAGNOSIS — E538 Deficiency of other specified B group vitamins: Secondary | ICD-10-CM

## 2024-05-19 ENCOUNTER — Ambulatory Visit (INDEPENDENT_AMBULATORY_CARE_PROVIDER_SITE_OTHER): Admitting: Physician Assistant

## 2024-05-19 ENCOUNTER — Encounter (INDEPENDENT_AMBULATORY_CARE_PROVIDER_SITE_OTHER): Payer: Self-pay | Admitting: Physician Assistant

## 2024-05-19 ENCOUNTER — Other Ambulatory Visit (HOSPITAL_BASED_OUTPATIENT_CLINIC_OR_DEPARTMENT_OTHER): Payer: Self-pay

## 2024-05-19 VITALS — BP 135/72 | HR 95 | Temp 98.2°F | Ht 67.0 in | Wt 209.0 lb

## 2024-05-19 DIAGNOSIS — G4733 Obstructive sleep apnea (adult) (pediatric): Secondary | ICD-10-CM

## 2024-05-19 DIAGNOSIS — E538 Deficiency of other specified B group vitamins: Secondary | ICD-10-CM

## 2024-05-19 DIAGNOSIS — E669 Obesity, unspecified: Secondary | ICD-10-CM

## 2024-05-19 DIAGNOSIS — Z6832 Body mass index (BMI) 32.0-32.9, adult: Secondary | ICD-10-CM | POA: Diagnosis not present

## 2024-05-19 DIAGNOSIS — E88819 Insulin resistance, unspecified: Secondary | ICD-10-CM

## 2024-05-19 DIAGNOSIS — E559 Vitamin D deficiency, unspecified: Secondary | ICD-10-CM | POA: Diagnosis not present

## 2024-05-19 DIAGNOSIS — R632 Polyphagia: Secondary | ICD-10-CM

## 2024-05-19 DIAGNOSIS — Z6831 Body mass index (BMI) 31.0-31.9, adult: Secondary | ICD-10-CM

## 2024-05-19 MED ORDER — FOLIC ACID 1 MG PO TABS
1.0000 mg | ORAL_TABLET | Freq: Every day | ORAL | 2 refills | Status: AC
  Filled 2024-05-19 – 2024-05-30 (×2): qty 30, 30d supply, fill #0

## 2024-05-19 MED ORDER — TIRZEPATIDE-WEIGHT MANAGEMENT 7.5 MG/0.5ML ~~LOC~~ SOAJ
7.5000 mg | SUBCUTANEOUS | 1 refills | Status: AC
  Filled 2024-05-19 – 2024-05-30 (×3): qty 2, 28d supply, fill #0

## 2024-05-19 MED ORDER — VITAMIN D (ERGOCALCIFEROL) 1.25 MG (50000 UNIT) PO CAPS
50000.0000 [IU] | ORAL_CAPSULE | ORAL | 0 refills | Status: AC
  Filled 2024-05-19: qty 4, 28d supply, fill #0
  Filled 2024-05-30: qty 5, 35d supply, fill #0

## 2024-05-19 NOTE — Progress Notes (Signed)
 "  SUBJECTIVE: Discussed the use of AI scribe software for clinical note transcription with the patient, who gave verbal consent to proceed.  Chief Complaint: Obesity  Interim History: She is up 3 lbs since her last visit.   Ramina is here to discuss her progress with her obesity treatment plan. She is on the Category 2 Plan and states she is following her eating plan approximately 50 % of the time. She states she is walking 30 minutes 1-2 times per week.  Lanessa Shill is a 74 year old female with obesity and obstructive sleep apnea who presents for follow-up of her obesity treatment plan.  She has a history of obstructive sleep apnea, insulin  resistance, vitamin D  deficiency, pure hypercholesterolemia, and B12 and folate deficiencies. She has gained three pounds since her last visit. She has been on Wegovy  2.4 mg weekly and we tried to transition to Mounjaro  7.5 mg once weekly (as she reported her insurance would cover Mounjaro  for her obstructive sleep apnea, but she did not receive the medication due to insurance issues- We discussed that FDA approved Zepbound  for OSA and it is the same medication and is clinically indicated and will attempt to see if they will cover tirzepatide  under Zepbound  for OSA.  She continued using Wegovy  until last week but faced supply issues with her pharmacy.  She frequently encounters issues with her pharmacy, noting that medications are often out of stock when needed. She plans to transfer all her medications to [facility].  She has been active and has built some muscle, but notes a slight increase in adipose tissue. She is concerned about her weight trajectory and feels Wegovy  has not been helping with hunger/appetite or cravings.  She is currently taking a low dose of Effexor , which she has been on since her second divorce. Her sister gained weight on Effexor , but she is on the lowest dose now. She has not been on Wellbutrin or bupropion for depression or  anxiety.   She is considering taking classes at [employer] and is looking for a part-time job to stay busy. She prefers to work on weekends. OBJECTIVE: Visit Diagnoses: Problem List Items Addressed This Visit     Polyphagia   Relevant Medications   tirzepatide  (ZEPBOUND ) 7.5 MG/0.5ML Pen   OSA (obstructive sleep apnea) - Primary   Relevant Medications   tirzepatide  (ZEPBOUND ) 7.5 MG/0.5ML Pen   Folate deficiency   Relevant Medications   folic acid  (FOLVITE ) 1 MG tablet   Vitamin D  deficiency   Relevant Medications   Vitamin D , Ergocalciferol , (DRISDOL ) 1.25 MG (50000 UNIT) CAPS capsule   Obesity   Relevant Medications   tirzepatide  (ZEPBOUND ) 7.5 MG/0.5ML Pen   Other Visit Diagnoses       Insulin  resistance       Relevant Medications   tirzepatide  (ZEPBOUND ) 7.5 MG/0.5ML Pen     BMI 32.0-32.9,adult Current BMI 32.8         Obstructive sleep apnea Managed with CPAP. Transition from Wegovy  to Mounjaro  7.5 mg weekly was attempted but not approved by insurance. Zepbound , FDA approved for obstructive sleep apnea, is being considered as an alternative/same medication. The patient had called her insurance and thought Mounjaro  was covered for OSA initially.  Insurance coverage for Zepbound  is uncertain, and cost may be a barrier. . - Ordered Zepbound  for obstructive sleep apnea as primary indication.  - Switched pharmacy to a pharmacy for better medication availability. - Monitor insurance approval and cost for Zepbound .  Obesity with insulin   resistance and polyphagia Obesity with insulin  resistance. Previous treatment with Wegovy  was not effective. Transition to Zepbound  for primary indication of OSA is planned, which may offer better weight loss outcomes and fewer side effects. Effexor  may contribute to weight gain, but current low dose is unlikely to be significant. Polyphagia is not controlled on current Wegovy  2.4 mg weekly.  - Initiated Zepbound  7.5 mg weekly for weight  management. - Monitor weight and side effects. - Will consider alternative medications if Zepbound  is not approved or effective.  Insulin  Resistance Last fasting insulin  was 5.3- at goal. A1c was 12.0- not at goal. Polyphagia:Yes Medication(s): Wegovy  2.4 mg SQ weekly Denies mass in neck, dysphagia, dyspepsia, persistent hoarseness, abdominal pain, or N/V/Constipation or diarrhea. Has annual eye exam. Mood is stable.   Lab Results  Component Value Date   HGBA1C 5.3 01/28/2024   HGBA1C 5.5 07/04/2023   HGBA1C 5.2 11/15/2022   Lab Results  Component Value Date   INSULIN  12.0 01/28/2024   INSULIN  22.6 07/04/2023   INSULIN  17.7 11/15/2022    Plan: Change to Zepbound  7.5 mg SQ weekly for primary indication of OSA.  Consider alternative medications if not covered by insurance. Continue working on nutrition plan to decrease simple carbohydrates, increase lean proteins and exercise to promote weight loss, improve glycemic control and prevent progression to Type 2 diabetes.   Vitamin D  Deficiency Vitamin D  is at goal of 50.  Most recent vitamin D  level was 50.1.Low vitamin D  levels can be associated with adiposity and may result in leptin resistance and weight gain. Also associated with fatigue.  Currently on vitamin D  supplementation without any adverse effects such as nausea, vomiting or muscle weakness.   She is on  prescription ergocalciferol  50,000 IU weekly. Lab Results  Component Value Date   VD25OH 50.1 01/28/2024   VD25OH 47.8 07/04/2023   VD25OH 49.0 02/01/2023    Plan: Continue and refill  prescription ergocalciferol  50,000 IU weekly Low vitamin D  levels can be associated with adiposity and may result in leptin resistance and weight gain. Also associated with fatigue.  Currently on vitamin D  supplementation without any adverse effects such as nausea, vomiting or muscle weakness.    Folate deficiency Folate deficiency on supplementation 1 mg daily as well as B 12 1000  mcg twice weekly. No SE. Lab Results  Component Value Date   FOLATE >20.0 01/28/2024   Lab Results  Component Value Date   VITAMINB12 >2000 (H) 01/28/2024  - Refilled/continue folate 1 mg daily prescription at pharmacy.   Meds ordered this encounter  Medications   tirzepatide  (ZEPBOUND ) 7.5 MG/0.5ML Pen    Sig: Inject 7.5 mg into the skin once a week.    Dispense:  6 mL    Refill:  1   Vitamin D , Ergocalciferol , (DRISDOL ) 1.25 MG (50000 UNIT) CAPS capsule    Sig: Take 1 capsule (50,000 Units total) by mouth every 7 (seven) days.    Dispense:  5 capsule    Refill:  0   folic acid  (FOLVITE ) 1 MG tablet    Sig: Take 1 tablet (1 mg total) by mouth daily.    Dispense:  30 tablet    Refill:  2    Vitals Temp: 98.2 F (36.8 C) BP: 135/72 Pulse Rate: 95 SpO2: 97 %   Anthropometric Measurements Height: 5' 7 (1.702 m) Weight: 209 lb (94.8 kg) BMI (Calculated): 32.73 Weight at Last Visit: 206 lb Weight Lost Since Last Visit: 0 Weight Gained Since Last Visit:  3 Starting Weight: 199 lb Total Weight Loss (lbs): 0 lb (0 kg)   Body Composition  Body Fat %: 42.5 % Fat Mass (lbs): 89 lbs Muscle Mass (lbs): 114.6 lbs Total Body Water (lbs): 77.6 lbs Visceral Fat Rating : 13   Other Clinical Data Today's Visit #: 17 Starting Date: 11/15/22     ASSESSMENT AND PLAN:  Diet: Maryagnes is currently in the action stage of change. As such, her goal is to get back to weight loss efforts. She has agreed to Category 2 Plan.  Exercise: Gabrianna has been instructed to work up to a goal of 150 minutes of combined cardio and strengthening exercise per week for weight loss and overall health benefits.   Behavior Modification:  We discussed the following Behavioral Modification Strategies today: increasing lean protein intake, decreasing simple carbohydrates, increasing vegetables, increase H2O intake, decrease liquid calories, increase high fiber foods, meal planning and cooking  strategies, better snacking choices, avoiding temptations, and planning for success. We discussed various medication options to help Nieshia with her weight loss efforts and we both agreed to start Zepbound  7.5 mg weekly  for primary indication of OSA as well as insulin  resistance, polyphagia and medical weight loss.  Return in about 4 weeks (around 06/16/2024).SABRA She was informed of the importance of frequent follow up visits to maximize her success with intensive lifestyle modifications for her multiple health conditions.  Attestation Statements:   Reviewed by clinician on day of visit: allergies, medications, problem list, medical history, surgical history, family history, social history, and previous encounter notes.   Time spent on visit including pre-visit chart review and post-visit care and charting was 39 minutes.    Basil Blakesley, PA-C  "

## 2024-05-21 ENCOUNTER — Telehealth: Payer: Self-pay

## 2024-05-21 NOTE — Telephone Encounter (Signed)
 PA submitted through Cover My Meds for Zepbound . Awaiting insurance determination. Key: [id number]

## 2024-05-28 ENCOUNTER — Other Ambulatory Visit (HOSPITAL_BASED_OUTPATIENT_CLINIC_OR_DEPARTMENT_OTHER): Payer: Self-pay

## 2024-05-29 ENCOUNTER — Other Ambulatory Visit (HOSPITAL_BASED_OUTPATIENT_CLINIC_OR_DEPARTMENT_OTHER): Payer: Self-pay

## 2024-05-30 ENCOUNTER — Other Ambulatory Visit (HOSPITAL_BASED_OUTPATIENT_CLINIC_OR_DEPARTMENT_OTHER): Payer: Self-pay

## 2024-05-31 ENCOUNTER — Other Ambulatory Visit (HOSPITAL_BASED_OUTPATIENT_CLINIC_OR_DEPARTMENT_OTHER): Payer: Self-pay

## 2024-06-05 ENCOUNTER — Other Ambulatory Visit (HOSPITAL_BASED_OUTPATIENT_CLINIC_OR_DEPARTMENT_OTHER): Payer: Self-pay

## 2024-06-16 ENCOUNTER — Ambulatory Visit (INDEPENDENT_AMBULATORY_CARE_PROVIDER_SITE_OTHER): Admitting: Physician Assistant
